# Patient Record
Sex: Male | Born: 1985 | Race: Black or African American | Hispanic: No | Marital: Married | Smoking: Never smoker
Health system: Southern US, Community
[De-identification: ages and names within clinical notes are randomized; demographics above are authoritative.]

---

## 2018-12-31 ENCOUNTER — Emergency Department: Payer: Commercial Managed Care - PPO

## 2018-12-31 ENCOUNTER — Other Ambulatory Visit: Payer: Self-pay

## 2018-12-31 ENCOUNTER — Emergency Department
Admission: EM | Admit: 2018-12-31 | Discharge: 2018-12-31 | Disposition: A | Discharge status: Home / Self Care | Payer: Commercial Managed Care - PPO | Attending: Emergency Medicine | Admitting: Emergency Medicine

## 2018-12-31 DIAGNOSIS — Y92410 Unspecified street and highway as the place of occurrence of the external cause: Secondary | ICD-10-CM | POA: Insufficient documentation

## 2018-12-31 DIAGNOSIS — S39012A Strain of muscle, fascia and tendon of lower back, initial encounter: Secondary | ICD-10-CM | POA: Diagnosis not present

## 2018-12-31 DIAGNOSIS — S3992XA Unspecified injury of lower back, initial encounter: Secondary | ICD-10-CM | POA: Diagnosis present

## 2018-12-31 DIAGNOSIS — Y999 Unspecified external cause status: Secondary | ICD-10-CM | POA: Insufficient documentation

## 2018-12-31 DIAGNOSIS — Y9389 Activity, other specified: Secondary | ICD-10-CM | POA: Diagnosis not present

## 2018-12-31 MED ORDER — MELOXICAM 15 MG PO TABS: 15.0000 mg | ORAL_TABLET | Freq: Every day | ORAL | 2 refills | Status: AC

## 2018-12-31 MED ORDER — CYCLOBENZAPRINE HCL 10 MG PO TABS: 10.0000 mg | ORAL_TABLET | Freq: Three times a day (TID) | ORAL | 0 refills | Status: AC | PRN
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
## 2018-12-31 NOTE — Discharge Instructions (Addendum)
Follow-up with your regular doctor if not better in 5 to 7 days we may see orthopedics.  Please return emergency department worsening.  Apply ice to all areas that hurt.  It is normal for you to feel more sore tomorrow and the next few days.  Take the medications to help decrease her symptoms.  Return as needed
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
## 2018-12-31 NOTE — ED Notes (Signed)

## 2019-01-11 ENCOUNTER — Emergency Department: Payer: Commercial Managed Care - PPO

## 2019-01-11 ENCOUNTER — Other Ambulatory Visit: Payer: Self-pay

## 2019-01-11 ENCOUNTER — Inpatient Hospital Stay
Admission: EM | Admit: 2019-01-11 | Discharge: 2019-01-13 | DRG: 494 | Disposition: A | Discharge status: Home Health | Payer: Commercial Managed Care - PPO | Attending: Internal Medicine | Admitting: Internal Medicine

## 2019-01-11 ENCOUNTER — Encounter: Payer: Self-pay | Admitting: Emergency Medicine

## 2019-01-11 DIAGNOSIS — S82201A Unspecified fracture of shaft of right tibia, initial encounter for closed fracture: Secondary | ICD-10-CM | POA: Diagnosis present

## 2019-01-11 DIAGNOSIS — Z20828 Contact with and (suspected) exposure to other viral communicable diseases: Secondary | ICD-10-CM | POA: Diagnosis present

## 2019-01-11 DIAGNOSIS — Z419 Encounter for procedure for purposes other than remedying health state, unspecified: Secondary | ICD-10-CM

## 2019-01-11 DIAGNOSIS — Z79899 Other long term (current) drug therapy: Secondary | ICD-10-CM

## 2019-01-11 DIAGNOSIS — S82231A Displaced oblique fracture of shaft of right tibia, initial encounter for closed fracture: Principal | ICD-10-CM | POA: Diagnosis present

## 2019-01-11 DIAGNOSIS — S82831A Other fracture of upper and lower end of right fibula, initial encounter for closed fracture: Secondary | ICD-10-CM | POA: Diagnosis present

## 2019-01-11 DIAGNOSIS — W010XXA Fall on same level from slipping, tripping and stumbling without subsequent striking against object, initial encounter: Secondary | ICD-10-CM | POA: Diagnosis present

## 2019-01-11 DIAGNOSIS — R030 Elevated blood-pressure reading, without diagnosis of hypertension: Secondary | ICD-10-CM | POA: Diagnosis present

## 2019-01-11 DIAGNOSIS — Y92009 Unspecified place in unspecified non-institutional (private) residence as the place of occurrence of the external cause: Secondary | ICD-10-CM

## 2019-01-11 DIAGNOSIS — X501XXA Overexertion from prolonged static or awkward postures, initial encounter: Secondary | ICD-10-CM | POA: Diagnosis not present

## 2019-01-11 DIAGNOSIS — S82241A Displaced spiral fracture of shaft of right tibia, initial encounter for closed fracture: Secondary | ICD-10-CM

## 2019-01-11 LAB — CBC WITH DIFFERENTIAL/PLATELET
Abs Immature Granulocytes: 0.03 10*3/uL (ref 0.00–0.07)
Basophils Absolute: 0 10*3/uL (ref 0.0–0.1)
Basophils Relative: 0 %
Eosinophils Absolute: 0 10*3/uL (ref 0.0–0.5)
Eosinophils Relative: 0 %
HCT: 44.6 % (ref 39.0–52.0)
Hemoglobin: 14 g/dL (ref 13.0–17.0)
Immature Granulocytes: 0 %
Lymphocytes Relative: 12 %
Lymphs Abs: 1.1 10*3/uL (ref 0.7–4.0)
MCH: 23.5 pg — ABNORMAL LOW (ref 26.0–34.0)
MCHC: 31.4 g/dL (ref 30.0–36.0)
MCV: 74.7 fL — ABNORMAL LOW (ref 80.0–100.0)
Monocytes Absolute: 0.6 10*3/uL (ref 0.1–1.0)
Monocytes Relative: 6 %
Neutro Abs: 7.8 10*3/uL — ABNORMAL HIGH (ref 1.7–7.7)
Neutrophils Relative %: 82 %
Platelets: 279 10*3/uL (ref 150–400)
RBC: 5.97 MIL/uL — ABNORMAL HIGH (ref 4.22–5.81)
RDW: 15.1 % (ref 11.5–15.5)
WBC: 9.5 10*3/uL (ref 4.0–10.5)
nRBC: 0 % (ref 0.0–0.2)

## 2019-01-11 LAB — PROTIME-INR
INR: 1 (ref 0.8–1.2)
Prothrombin Time: 13 seconds (ref 11.4–15.2)

## 2019-01-11 LAB — BASIC METABOLIC PANEL
Anion gap: 8 (ref 5–15)
BUN: 15 mg/dL (ref 6–20)
CO2: 26 mmol/L (ref 22–32)
Calcium: 9 mg/dL (ref 8.9–10.3)
Chloride: 102 mmol/L (ref 98–111)
Creatinine, Ser: 1.04 mg/dL (ref 0.61–1.24)
GFR calc Af Amer: >60 mL/min (ref >60)
GFR calc non Af Amer: >60 mL/min (ref >60)
Glucose, Bld: 147 mg/dL — ABNORMAL HIGH (ref 70–99)
Potassium: 4.1 mmol/L (ref 3.5–5.1)
Sodium: 136 mmol/L (ref 135–145)

## 2019-01-11 LAB — SARS CORONAVIRUS 2 BY RT PCR (HOSPITAL ORDER, PERFORMED IN ~~LOC~~ HOSPITAL LAB): SARS Coronavirus 2: NEGATIVE

## 2019-01-11 LAB — SURGICAL PCR SCREEN
MRSA, PCR: NEGATIVE
Staphylococcus aureus: NEGATIVE

## 2019-01-11 LAB — TYPE AND SCREEN
ABO/RH(D): O POS
Antibody Screen: NEGATIVE

## 2019-01-11 MED ORDER — CLINDAMYCIN PHOSPHATE 600 MG/50ML IV SOLN
600.0000 mg | INTRAVENOUS | Status: AC
  Filled 2019-01-11: qty 50

## 2019-01-11 MED ORDER — SODIUM CHLORIDE 0.9 % IV SOLN: INTRAVENOUS | Status: DC

## 2019-01-11 MED ORDER — MUPIROCIN 2 % EX OINT
1.0000 "application " | TOPICAL_OINTMENT | Freq: Two times a day (BID) | CUTANEOUS | Status: DC
  Filled 2019-01-11: qty 22

## 2019-01-11 MED ORDER — MORPHINE SULFATE (PF) 2 MG/ML IV SOLN: 1.0000 mg | INTRAVENOUS | Status: DC | PRN

## 2019-01-11 MED ORDER — HYDRALAZINE HCL 20 MG/ML IJ SOLN: 10.0000 mg | Freq: Four times a day (QID) | INTRAMUSCULAR | Status: DC | PRN

## 2019-01-11 MED ORDER — MORPHINE SULFATE (PF) 2 MG/ML IV SOLN
1.0000 mg | INTRAVENOUS | Status: DC | PRN
  Administered 2019-01-11 – 2019-01-12 (×3): 1 mg via INTRAVENOUS
  Filled 2019-01-11 (×3): qty 1

## 2019-01-11 MED ORDER — CEFAZOLIN SODIUM-DEXTROSE 1-4 GM/50ML-% IV SOLN
1.0000 g | INTRAVENOUS | Status: DC
  Filled 2019-01-11: qty 50

## 2019-01-11 MED ORDER — MORPHINE SULFATE (PF) 4 MG/ML IV SOLN
8.0000 mg | Freq: Once | INTRAVENOUS | Status: AC
  Administered 2019-01-11: 10:00:00 8 mg via INTRAVENOUS
  Filled 2019-01-11: qty 2

## 2019-01-11 MED ORDER — CEFAZOLIN SODIUM-DEXTROSE 2-4 GM/100ML-% IV SOLN
2.0000 g | INTRAVENOUS | Status: AC
  Filled 2019-01-11: qty 100

## 2019-01-11 MED ORDER — HYDROCODONE-ACETAMINOPHEN 5-325 MG PO TABS
1.0000 | ORAL_TABLET | ORAL | Status: DC | PRN
  Administered 2019-01-11 – 2019-01-13 (×7): 2 via ORAL
  Filled 2019-01-11 (×7): qty 2

## 2019-01-11 MED ORDER — CEFAZOLIN SODIUM-DEXTROSE 2-4 GM/100ML-% IV SOLN: 2.0000 g | Freq: Once | INTRAVENOUS | Status: DC

## 2019-01-11 MED ORDER — SODIUM CHLORIDE 0.9 % IV SOLN
INTRAVENOUS | Status: DC
  Administered 2019-01-11 (×2): via INTRAVENOUS

## 2019-01-11 NOTE — ED Notes (Addendum)
EDP and ED tech at bedside to apply splint to RLE
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
## 2019-01-11 NOTE — ED Notes (Signed)
Attempt to call report, assignment not approved yet. Will return call.

## 2019-01-11 NOTE — Consult Note (Signed)
ORTHOPAEDIC CONSULTATION  REQUESTING PHYSICIAN: Lang Snow,*  Chief Complaint: Right leg pain  HPI: Jesse Wagner is a 33 y.o. male who complains of  Right leg after a fall at home today. Brought to ER where exam and x-rays show a displaced right distal 1/3 tibial fracture, short, oblique.  Proximal fibula fx as well. Admitted for surgical fixation of fracture. Discussed surgical vs. Non operative treatment with him and he agrees to surgery. Very difficult to control this fx in a cast. Risks and benefits and post op protocol dicussed.   History reviewed. No pertinent past medical history. History reviewed. No pertinent surgical history. Social History   Socioeconomic History  . Marital status: Married    Spouse name: Not on file  . Number of children: Not on file  . Years of education: Not on file  . Highest education level: Not on file  Occupational History  . Not on file  Social Needs  . Financial resource strain: Not on file  . Food insecurity    Worry: Not on file    Inability: Not on file  . Transportation needs    Medical: Not on file    Non-medical: Not on file  Tobacco Use  . Smoking status: Never Smoker  . Smokeless tobacco: Never Used  Substance and Sexual Activity  . Alcohol use: Never    Frequency: Never  . Drug use: Never  . Sexual activity: Not on file  Lifestyle  . Physical activity    Days per week: Not on file    Minutes per session: Not on file  . Stress: Not on file  Relationships  . Social Herbalist on phone: Not on file    Gets together: Not on file    Attends religious service: Not on file    Active member of club or organization: Not on file    Attends meetings of clubs or organizations: Not on file    Relationship status: Not on file  Other Topics Concern  . Not on file  Social History Narrative  . Not on file   History reviewed. No pertinent family history. No Known Allergies Prior to Admission medications    Medication Sig Start Date End Date Taking? Authorizing Provider  cyclobenzaprine (FLEXERIL) 10 MG tablet Take 1 tablet (10 mg total) by mouth 3 (three) times daily as needed. Patient taking differently: Take 10 mg by mouth 3 (three) times daily as needed for muscle spasms.  12/31/18  Yes Fisher, Linden Dolin, PA-C  meloxicam (MOBIC) 15 MG tablet Take 1 tablet (15 mg total) by mouth daily. Patient taking differently: Take 15 mg by mouth daily as needed for pain.  12/31/18 12/31/19 Yes Fisher, Linden Dolin, PA-C   Dg Knee 2 Views Right  Result Date: 01/11/2019 CLINICAL DATA:  Right knee pain after fall today. EXAM: RIGHT KNEE - 1-2 VIEW COMPARISON:  None. FINDINGS: Moderately displaced oblique fracture is seen involving the proximal right fibular shaft. No other bony abnormality is noted involving the knee. No joint effusion is noted. Joint spaces are intact. IMPRESSION: Moderately displaced proximal right fibular fracture. Electronically Signed   By: Marijo Conception M.D.   On: 01/11/2019 10:07   Dg Ankle Complete Right  Result Date: 01/11/2019 CLINICAL DATA:  Right leg pain after fall. EXAM: RIGHT ANKLE - COMPLETE 3+ VIEW COMPARISON:  None. FINDINGS: Severely displaced oblique fracture is seen involving the distal right tibial shaft. No other definite abnormality is noted in the  ankle. Joint spaces are intact. IMPRESSION: Severely displaced distal right tibial oblique fracture. Electronically Signed   By: Lupita RaiderJames  Green Jr M.D.   On: 01/11/2019 10:05    Positive ROS: All other systems have been reviewed and were otherwise negative with the exception of those mentioned in the HPI and as above.  Physical Exam: General: Alert, no acute distress Cardiovascular: No pedal edema Respiratory: No cyanosis, no use of accessory musculature GI: No organomegaly, abdomen is soft and non-tender Skin: No lesions in the area of chief complaint Neurologic: Sensation intact distally Psychiatric: Patient is competent for  consent with normal mood and affect Lymphatic: No axillary or cervical lymphadenopathy  MUSCULOSKELETAL: Right leg in splints.  CSM good distally . No other injuries noted.   Assessment: Right tibial fx with displacement  Plan: ORIF right tibia with IM rod    Valinda HoarHoward E Dulcemaria Bula, MD [phone]   01/11/2019 3:12 PM

## 2019-01-11 NOTE — ED Notes (Signed)
Report to Kim, RN

## 2019-01-11 NOTE — H&P (Signed)
[employer] [facility] - [place] at [facility]   PATIENT NAME: Jesse Wagner    MR#:  [id number]  DATE OF BIRTH:  09/12/1985  DATE OF ADMISSION:  01/11/2019  PRIMARY CARE PHYSICIAN: Patient, No Pcp Per   REQUESTING/REFERRING PHYSICIAN: Chesley NoonJessup, Carnie, MD  CHIEF COMPLAINT:   Chief Complaint  Patient presents with  . Leg Pain    HISTORY OF PRESENT ILLNESS:   33 year old male with no known past medical history presenting to the ED with chief complaints of right leg pain status post mechanical fall this morning.  Patient states he tripped on a child toy that was laying on a rug outside of his house and fell forward on the ground inverted his ankle.  He denies hitting his head or losing consciousness.  Patient states he was unable to get up without assistance and currently unable to bear weight on his right leg.  On arrival to the ED, he was afebrile with blood pressure 161/96 mm Hg and pulse rate 108 beats/min. There were no focal neurological deficits; he was alert and oriented x4 but appears to be in significant pain.  Initial labs with unremarkable CBC and BMP.  X-ray of right ankle shows severely displaced distal right tibial oblique fracture, x-ray of right knee shows moderately displaced proximal right fibular fracture.  Given this finding on imaging patient will be admitted under hospitalist service for further management.  PAST MEDICAL HISTORY:  History reviewed. No pertinent past medical history.  PAST SURGICAL HISTORY:  History reviewed. No pertinent surgical history.  SOCIAL HISTORY:   Social History   Tobacco Use  . Smoking status: Never Smoker  . Smokeless tobacco: Never Used  Substance Use Topics  . Alcohol use: Never    Frequency: Never    FAMILY HISTORY:  No family history on file.  DRUG ALLERGIES:  No Known Allergies  REVIEW OF SYSTEMS:   Review of Systems  Constitutional: Negative for chills, fever, malaise/fatigue and weight loss.  HENT:  Negative for congestion, hearing loss and sore throat.   Eyes: Negative for blurred vision and double vision.  Respiratory: Negative for cough, shortness of breath and wheezing.   Cardiovascular: Positive for leg swelling. Negative for chest pain, palpitations and orthopnea.       Right leg swelling due to traumatic fall  Gastrointestinal: Negative for abdominal pain, diarrhea, nausea and vomiting.  Genitourinary: Negative for dysuria and urgency.  Musculoskeletal: Positive for falls and joint pain. Negative for myalgias.  Skin: Negative for rash.  Neurological: Negative for dizziness, sensory change, speech change, focal weakness and headaches.  Psychiatric/Behavioral: Negative for depression.   MEDICATIONS AT HOME:   Prior to Admission medications   Medication Sig Start Date End Date Taking? Authorizing Provider  cyclobenzaprine (FLEXERIL) 10 MG tablet Take 1 tablet (10 mg total) by mouth 3 (three) times daily as needed. 12/31/18   Fisher, Roselyn BeringSusan W, PA-C  meloxicam (MOBIC) 15 MG tablet Take 1 tablet (15 mg total) by mouth daily. 12/31/18 12/31/19  Fisher, Roselyn BeringSusan W, PA-C     VITAL SIGNS:  Blood pressure (!) 145/82, pulse 94, temperature 99 F (37.2 C), temperature source Oral, resp. rate 18, height 5\' 8"  (1.727 m), weight 83.5 kg, SpO2 96 %.  PHYSICAL EXAMINATION:   Physical Exam  GENERAL:  33 y.o.-year-old patient lying in the bed with no acute distress.  EYES: Pupils equal, round, reactive to light and accommodation. No scleral icterus. Extraocular muscles intact.  HEENT: Head atraumatic, normocephalic. Oropharynx and nasopharynx clear.  NECK:  Supple, no jugular venous distention. No thyroid enlargement, no tenderness.  LUNGS: Normal breath sounds bilaterally, no wheezing, rales,rhonchi or crepitation. No use of accessory muscles of respiration.  CARDIOVASCULAR: S1, S2 normal. No murmurs, rubs, or gallops.  ABDOMEN: Soft, nontender, nondistended. Bowel sounds present. No organomegaly  or mass.  EXTREMITIES: No pedal edema, cyanosis, or clubbing. No rash or lesions. + pedal pulses MUSCULOSKELETAL: Normal bulk, and power was 5+ grip and elbow, knee, and ankle flexion and extension on the left. Unable to asses the right due to injury NEUROLOGIC:Alert and oriented x 3. CN 2-12 intact. Sensation to light touch and cold stimuli intact bilaterally. Gait not tested due to safety concern. PSYCHIATRIC: The patient is alert and oriented x 3.  SKIN: Erythema in anterior aspect of right leg with obvious deformity  DATA REVIEWED:  LABORATORY PANEL:   CBC Recent Labs  Lab 01/11/19 0937  WBC 9.5  HGB 14.0  HCT 44.6  PLT 279   ------------------------------------------------------------------------------------------------------------------  Chemistries  Recent Labs  Lab 01/11/19 0937  NA 136  K 4.1  CL 102  CO2 26  GLUCOSE 147*  BUN 15  CREATININE 1.04  CALCIUM 9.0   ------------------------------------------------------------------------------------------------------------------  Cardiac Enzymes No results for input(s): TROPONINI in the last 168 hours. ------------------------------------------------------------------------------------------------------------------  RADIOLOGY:  Dg Knee 2 Views Right  Result Date: 01/11/2019 CLINICAL DATA:  Right knee pain after fall today. EXAM: RIGHT KNEE - 1-2 VIEW COMPARISON:  None. FINDINGS: Moderately displaced oblique fracture is seen involving the proximal right fibular shaft. No other bony abnormality is noted involving the knee. No joint effusion is noted. Joint spaces are intact. IMPRESSION: Moderately displaced proximal right fibular fracture. Electronically Signed   By: Marijo Conception M.D.   On: 01/11/2019 10:07   Dg Ankle Complete Right  Result Date: 01/11/2019 CLINICAL DATA:  Right leg pain after fall. EXAM: RIGHT ANKLE - COMPLETE 3+ VIEW COMPARISON:  None. FINDINGS: Severely displaced oblique fracture is seen  involving the distal right tibial shaft. No other definite abnormality is noted in the ankle. Joint spaces are intact. IMPRESSION: Severely displaced distal right tibial oblique fracture. Electronically Signed   By: Marijo Conception M.D.   On: 01/11/2019 10:05    EKG:  EKG: there are no previous tracings available.  IMPRESSION AND PLAN:   33 y.o. male  with no known past medical history presenting to the ED with chief complaints of right leg pain status post mechanical fall this morning.  1. Right tibial fracture -secondary to mechanical fall - Admit to MedSurg unit - X-ray of right ankle shows severely displaced distal right tibial oblique fracture - PRN p.o. and IV pain management - We will avoid anticoagulation pending surgery - Keep n.p.o. - Cleared for surgery from medicine standpoint - Orthopedic consult, discussed with Dr. Sabra Heck for possible surgery today  2. Right fibula fracture -secondary to mechanical fall -X-ray right knee shows moderately displaced proximal right fibula fracture -Management as above  3. Elevated blood pressure -may be due to pain, no history of hypertension -PRN antihypertensive  4. DVT prophylaxis - Hold anti-coagulation for pending procedure   All the records are reviewed and case discussed with ED provider. Management plans discussed with the patient, family and they are in agreement.  CODE STATUS: FULL  TOTAL TIME TAKING CARE OF THIS PATIENT: 50 minutes.    on 01/11/2019 at 10:25 AM   Rufina Falco, DNP, FNP-BC [facility] Nurse Practitioner Between 7am to 6pm - Pager -  [phone]  After 6pm go to [email or web address] - password [email or web address]  [facility]  Office  [phone]  CC: Primary care physician; Patient, No Pcp Per

## 2019-01-11 NOTE — ED Notes (Signed)
Patient ready to transport to floor per charge, tech transport to room 135

## 2019-01-11 NOTE — Progress Notes (Signed)
Per protocol, cefazolin pre-surgical dose adjusted to 2 g as pt is >/= 80 kg.  Gerald Dexter, PharmD Pharmacy Resident  01/11/2019 1:17 PM

## 2019-01-11 NOTE — ED Notes (Signed)
Cont to be unable to take report.

## 2019-01-11 NOTE — ED Triage Notes (Signed)
Pt in via POV, reports mechanical fall outside of his house, pain from right ankle up to knee; obvious deformity noted.  Pt unable to bear weight to that extremity since the fall.  nad noted at this time.

## 2019-01-11 NOTE — Progress Notes (Signed)
Patient seen and examined  Agree with the plan and assessment formulated by Rufina Falco, NP. Patient admitted with right tibial fracture.  Patient will be seen by orthopedic surgery for possible surgery.  Elevated blood pressure is due to pain will use PRN medications   15 minutes spent

## 2019-01-11 NOTE — ED Notes (Signed)
X-ray at bedside

## 2019-01-11 NOTE — ED Provider Notes (Signed)
[facility] Emergency Department Provider Note   ____________________________________________   First MD Initiated Contact with Patient 01/11/19 [phone]     (approximate)  I have reviewed the triage vital signs and the nursing notes.   HISTORY  Chief Complaint Leg Pain    HPI Jesse Wagner is a 33 y.o. male with no significant past medical history who presents to the ED complaining of ankle and knee pain.  Patient reports that earlier this morning he tripped and fell on a mat outside of his house, inverting his ankle and falling to the ground.  He has had significant pain and swelling around his right ankle since then, along with pain and swelling in the distal part of his right knee.  He has been unable to bear weight on this leg, but has not had any injury to his left lower extremity and bears weight on his left leg without difficulty.  He denies hitting his head or any other injuries.  He last ate some sausage and eggs around 9 AM this morning.        History reviewed. No pertinent past medical history.  Patient Active Problem List   Diagnosis Date Noted  . Right tibial fracture 01/11/2019    History reviewed. No pertinent surgical history.  Prior to Admission medications   Medication Sig Start Date End Date Taking? Authorizing Provider  cyclobenzaprine (FLEXERIL) 10 MG tablet Take 1 tablet (10 mg total) by mouth 3 (three) times daily as needed. Patient taking differently: Take 10 mg by mouth 3 (three) times daily as needed for muscle spasms.  12/31/18  Yes Fisher, Linden Dolin, PA-C  meloxicam (MOBIC) 15 MG tablet Take 1 tablet (15 mg total) by mouth daily. Patient taking differently: Take 15 mg by mouth daily as needed for pain.  12/31/18 12/31/19 Yes Fisher, Linden Dolin, PA-C    Allergies Patient has no known allergies.  No family history on file.  Social History Social History   Tobacco Use  . Smoking status: Never Smoker  . Smokeless tobacco:  Never Used  Substance Use Topics  . Alcohol use: Never    Frequency: Never  . Drug use: Never    Review of Systems  Constitutional: No fever/chills Eyes: No visual changes. ENT: No sore throat. Cardiovascular: Denies chest pain. Respiratory: Denies shortness of breath. Gastrointestinal: No abdominal pain.  No nausea, no vomiting.  No diarrhea.  No constipation. Genitourinary: Negative for dysuria. Musculoskeletal: Negative for back pain.  Positive for right ankle and knee pain. Skin: Negative for rash. Neurological: Negative for headaches, focal weakness or numbness.  ____________________________________________   PHYSICAL EXAM:  VITAL SIGNS: ED Triage Vitals  Enc Vitals Group     BP 01/11/19 0923 (!) 161/96     Pulse Rate 01/11/19 0923 (!) 108     Resp 01/11/19 0923 16     Temp 01/11/19 0923 99 F (37.2 C)     Temp Source 01/11/19 0923 Oral     SpO2 01/11/19 0923 98 %     Weight 01/11/19 0924 184 lb (83.5 kg)     Height 01/11/19 0924 5\' 8"  (1.727 m)     Head Circumference --      Peak Flow --      Pain Score 01/11/19 0923 10     Pain Loc --      Pain Edu? --      Excl. in [place]? --     Constitutional: Alert and oriented. Eyes: Conjunctivae are normal.  Head: Atraumatic. Nose: No congestion/rhinnorhea. Mouth/Throat: Mucous membranes are moist. Neck: Normal ROM Cardiovascular: Normal rate, regular rhythm. Grossly normal heart sounds. Respiratory: Normal respiratory effort.  No retractions. Lungs CTAB. Gastrointestinal: Soft and nontender. No distention. Genitourinary: deferred Musculoskeletal: Edema to right ankle diffusely with tenderness along medial malleolus, no tenderness along head of first and fifth metatarsals.  Edema to proximal right shin with tenderness along lateral lower leg, no tenderness to more proximal knee.  2+ DP pulses bilaterally, right lower extremity warm and well-perfused. Neurologic:  Normal speech and language. No gross focal neurologic  deficits are appreciated.  Sensation intact throughout right lower extremity. Skin:  Skin is warm, dry and intact. No rash noted. Psychiatric: Mood and affect are normal. Speech and behavior are normal.  ____________________________________________   LABS (all labs ordered are listed, but only abnormal results are displayed)  Labs Reviewed  BASIC METABOLIC PANEL - Abnormal; Notable for the following components:      Result Value   Glucose, Bld 147 (*)    All other components within normal limits  CBC WITH DIFFERENTIAL/PLATELET - Abnormal; Notable for the following components:   RBC 5.97 (*)    MCV 74.7 (*)    MCH 23.5 (*)    Neutro Abs 7.8 (*)    All other components within normal limits  SARS CORONAVIRUS 2 (HOSPITAL ORDER, PERFORMED IN [facility])  PROTIME-INR  HIV ANTIBODY (ROUTINE TESTING W REFLEX)  TYPE AND SCREEN     PROCEDURES  Procedure(s) performed (including Critical Care):  .Ortho Injury Treatment  Date/Time: 01/11/2019 2:47 PM Performed by: Chesley NoonJessup, Milton, MD Authorized by: Chesley NoonJessup, Loki, MD   Consent:    Consent obtained:  Verbal   Consent given by:  PatientInjury location: lower leg Location details: right lower leg Injury type: fracture Fracture type: tibial shaft Pre-procedure neurovascular assessment: neurovascularly intact Pre-procedure distal perfusion: normal Pre-procedure neurological function: normal Pre-procedure range of motion: reduced  Anesthesia: Local anesthesia used: no  Patient sedated: NoManipulation performed: yes Skin traction used: no Skeletal traction used: yes Reduction successful: yes Immobilization: splint Splint type: short leg Supplies used: cotton padding and Ortho-Glass Post-procedure neurovascular assessment: post-procedure neurovascularly intact Post-procedure distal perfusion: normal Post-procedure neurological function: normal Post-procedure range of motion: unchanged Patient tolerance: patient  tolerated the procedure well with no immediate complications      ____________________________________________   INITIAL IMPRESSION / ASSESSMENT AND PLAN / ED COURSE       33 year old male presenting to the ED following trip and fall, everting his right ankle.  He has significant pain with obvious deformity to right ankle and tenderness at proximal lower leg, concerning for Maisonneuve fracture.  Will assess with x-rays, treat pain.  No other apparent traumatic injuries.  X-rays show fracture of distal tibial shaft with ankle mortise intact, additional fracture noted at proximal fibula.  Case discussed with Dr. Hyacinth MeekerMiller of orthopedic surgery, recommends placement of U-splint and short leg splint, will plan for operative intervention later this afternoon.  Splint applied for patient comfort and stabilization with reduction of displaced tibial fracture.  Case discussed with hospitalist, who accepts patient for admission.      ____________________________________________   FINAL CLINICAL IMPRESSION(S) / ED DIAGNOSES  Final diagnoses:  Closed displaced oblique fracture of shaft of right tibia, initial encounter  Closed fracture of proximal end of right fibula, unspecified fracture morphology, initial encounter     ED Discharge Orders    None       Note:  This document  was prepared using Conservation officer, [employer] voice recognition software and may include unintentional dictation errors.   Chesley NoonJessup, Marquan, MD 01/11/19 [phone]

## 2019-01-11 NOTE — Anesthesia Preprocedure Evaluation (Addendum)
Anesthesia Evaluation  Patient identified by MRN, date of birth, ID band Patient awake    Reviewed: Allergy & Precautions, H&P , NPO status , Patient's Chart, lab work & pertinent test results  History of Anesthesia Complications Negative for: history of anesthetic complications  Airway Mallampati: II  TM Distance: >3 FB Neck ROM: full    Dental  (+) Chipped, Poor Dentition   Pulmonary neg pulmonary ROS, neg shortness of breath,           Cardiovascular Exercise Tolerance: Good (-) angina(-) Past MI and (-) DOE negative cardio ROS       Neuro/Psych negative neurological ROS  negative psych ROS   GI/Hepatic negative GI ROS, Neg liver ROS, neg GERD  ,  Endo/Other  negative endocrine ROS  Renal/GU      Musculoskeletal   Abdominal   Peds  Hematology negative hematology ROS (+)   Anesthesia Other Findings  BMI    Body Mass Index: 27.98 kg/m      Reproductive/Obstetrics negative OB ROS                             Anesthesia Physical Anesthesia Plan  ASA: I  Anesthesia Plan: Spinal   Post-op Pain Management:    Induction: Intravenous  PONV Risk Score and Plan: Ondansetron, Dexamethasone, Midazolam and Treatment may vary due to age or medical condition  Airway Management Planned: Nasal Cannula  Additional Equipment:   Intra-op Plan:   Post-operative Plan:   Informed Consent: I have reviewed the patients History and Physical, chart, labs and discussed the procedure including the risks, benefits and alternatives for the proposed anesthesia with the patient or authorized representative who has indicated his/her understanding and acceptance.     Dental Advisory Given  Plan Discussed with: Anesthesiologist, CRNA and Surgeon  Anesthesia Plan Comments: (Patient consented for risks of anesthesia including but not limited to:  - adverse reactions to medications - damage to  teeth, lips or other oral mucosa - sore throat or hoarseness - Damage to heart, brain, lungs or loss of life  Patient voiced understanding and prefers spinal for case...understands risks and benefits.)    Anesthesia Quick Evaluation

## 2019-01-12 ENCOUNTER — Encounter: Payer: Self-pay | Admitting: Anesthesiology

## 2019-01-12 ENCOUNTER — Inpatient Hospital Stay: Payer: Commercial Managed Care - PPO | Admitting: Anesthesiology

## 2019-01-12 ENCOUNTER — Inpatient Hospital Stay: Payer: Commercial Managed Care - PPO

## 2019-01-12 ENCOUNTER — Encounter
Admission: EM | Disposition: A | Discharge status: Home Health | Payer: Self-pay | Source: Home / Self Care | Attending: Internal Medicine

## 2019-01-12 HISTORY — PX: TIBIA IM NAIL INSERTION: SHX2516

## 2019-01-12 LAB — CBC
HCT: 42.7 % (ref 39.0–52.0)
Hemoglobin: 13 g/dL (ref 13.0–17.0)
MCH: 23.6 pg — ABNORMAL LOW (ref 26.0–34.0)
MCHC: 30.4 g/dL (ref 30.0–36.0)
MCV: 77.6 fL — ABNORMAL LOW (ref 80.0–100.0)
Platelets: 263 10*3/uL (ref 150–400)
RBC: 5.5 MIL/uL (ref 4.22–5.81)
RDW: 15.7 % — ABNORMAL HIGH (ref 11.5–15.5)
WBC: 6.9 10*3/uL (ref 4.0–10.5)
nRBC: 0 % (ref 0.0–0.2)

## 2019-01-12 LAB — CREATININE, SERUM
Creatinine, Ser: 1.13 mg/dL (ref 0.61–1.24)
GFR calc Af Amer: >60 mL/min (ref >60)
GFR calc non Af Amer: >60 mL/min (ref >60)

## 2019-01-12 SURGERY — INSERTION, INTRAMEDULLARY ROD, TIBIA
Anesthesia: Spinal | Laterality: Right

## 2019-01-12 MED ORDER — BISACODYL 10 MG RE SUPP: 10.0000 mg | Freq: Every day | RECTAL | Status: DC | PRN

## 2019-01-12 MED ORDER — FENTANYL CITRATE (PF) 100 MCG/2ML IJ SOLN
INTRAMUSCULAR | Status: AC
  Filled 2019-01-12: qty 2

## 2019-01-12 MED ORDER — CLINDAMYCIN PHOSPHATE 600 MG/50ML IV SOLN
600.0000 mg | Freq: Three times a day (TID) | INTRAVENOUS | Status: AC
  Administered 2019-01-12 – 2019-01-13 (×3): 600 mg via INTRAVENOUS
  Filled 2019-01-12 (×3): qty 50

## 2019-01-12 MED ORDER — PROPOFOL 10 MG/ML IV BOLUS
INTRAVENOUS | Status: AC
  Filled 2019-01-12: qty 20

## 2019-01-12 MED ORDER — METOCLOPRAMIDE HCL 5 MG/ML IJ SOLN: 5.0000 mg | Freq: Three times a day (TID) | INTRAMUSCULAR | Status: DC | PRN

## 2019-01-12 MED ORDER — LIDOCAINE HCL (PF) 2 % IJ SOLN
INTRAMUSCULAR | Status: AC
  Filled 2019-01-12: qty 10

## 2019-01-12 MED ORDER — OXYCODONE HCL 5 MG/5ML PO SOLN: 5.0000 mg | Freq: Once | ORAL | Status: DC | PRN

## 2019-01-12 MED ORDER — BUPIVACAINE HCL (PF) 0.5 % IJ SOLN
INTRAMUSCULAR | Status: DC | PRN
  Administered 2019-01-12: 3 mL

## 2019-01-12 MED ORDER — CLINDAMYCIN PHOSPHATE 600 MG/50ML IV SOLN
INTRAVENOUS | Status: DC | PRN
  Administered 2019-01-12: 600 mg via INTRAVENOUS

## 2019-01-12 MED ORDER — ONDANSETRON HCL 4 MG PO TABS: 4.0000 mg | ORAL_TABLET | Freq: Four times a day (QID) | ORAL | Status: DC | PRN

## 2019-01-12 MED ORDER — SODIUM CHLORIDE 0.45 % IV SOLN
INTRAVENOUS | Status: DC
  Administered 2019-01-12: 17:00:00 via INTRAVENOUS

## 2019-01-12 MED ORDER — METHOCARBAMOL 1000 MG/10ML IJ SOLN
500.0000 mg | Freq: Four times a day (QID) | INTRAVENOUS | Status: DC | PRN
  Filled 2019-01-12: qty 5

## 2019-01-12 MED ORDER — METHOCARBAMOL 500 MG PO TABS
500.0000 mg | ORAL_TABLET | Freq: Four times a day (QID) | ORAL | Status: DC | PRN
  Administered 2019-01-12 – 2019-01-13 (×2): 500 mg via ORAL
  Filled 2019-01-12 (×2): qty 1

## 2019-01-12 MED ORDER — MIDAZOLAM HCL 2 MG/2ML IJ SOLN
INTRAMUSCULAR | Status: AC
  Filled 2019-01-12: qty 2

## 2019-01-12 MED ORDER — CEFAZOLIN SODIUM-DEXTROSE 2-3 GM-%(50ML) IV SOLR
INTRAVENOUS | Status: DC | PRN
  Administered 2019-01-12: 2 g via INTRAVENOUS

## 2019-01-12 MED ORDER — ONDANSETRON HCL 4 MG/2ML IJ SOLN
INTRAMUSCULAR | Status: AC
  Filled 2019-01-12: qty 2

## 2019-01-12 MED ORDER — DEXAMETHASONE SODIUM PHOSPHATE 10 MG/ML IJ SOLN
INTRAMUSCULAR | Status: AC
  Filled 2019-01-12: qty 1

## 2019-01-12 MED ORDER — ROCURONIUM BROMIDE 50 MG/5ML IV SOLN
INTRAVENOUS | Status: AC
  Filled 2019-01-12: qty 1

## 2019-01-12 MED ORDER — FENTANYL CITRATE (PF) 100 MCG/2ML IJ SOLN: 25.0000 ug | INTRAMUSCULAR | Status: DC | PRN

## 2019-01-12 MED ORDER — DOCUSATE SODIUM 100 MG PO CAPS
100.0000 mg | ORAL_CAPSULE | Freq: Two times a day (BID) | ORAL | Status: DC
  Administered 2019-01-12 – 2019-01-13 (×2): 100 mg via ORAL
  Filled 2019-01-12 (×2): qty 1

## 2019-01-12 MED ORDER — METOCLOPRAMIDE HCL 10 MG PO TABS: 5.0000 mg | ORAL_TABLET | Freq: Three times a day (TID) | ORAL | Status: DC | PRN

## 2019-01-12 MED ORDER — ONDANSETRON HCL 4 MG/2ML IJ SOLN: 4.0000 mg | Freq: Four times a day (QID) | INTRAMUSCULAR | Status: DC | PRN

## 2019-01-12 MED ORDER — PROPOFOL 10 MG/ML IV BOLUS
INTRAVENOUS | Status: DC | PRN
  Administered 2019-01-12 (×2): 30 mg via INTRAVENOUS

## 2019-01-12 MED ORDER — ONDANSETRON HCL 4 MG/2ML IJ SOLN: 4.0000 mg | Freq: Once | INTRAMUSCULAR | Status: DC | PRN

## 2019-01-12 MED ORDER — PROPOFOL 500 MG/50ML IV EMUL
INTRAVENOUS | Status: DC | PRN
  Administered 2019-01-12: 75 ug/kg/min via INTRAVENOUS

## 2019-01-12 MED ORDER — OXYCODONE HCL 5 MG PO TABS: 5.0000 mg | ORAL_TABLET | Freq: Once | ORAL | Status: DC | PRN

## 2019-01-12 MED ORDER — BUPIVACAINE-EPINEPHRINE (PF) 0.25% -1:200000 IJ SOLN
INTRAMUSCULAR | Status: DC | PRN
  Administered 2019-01-12: 30 mL via PERINEURAL

## 2019-01-12 MED ORDER — NEOMYCIN-POLYMYXIN B GU 40-200000 IR SOLN
Status: DC | PRN
  Administered 2019-01-12: 4 mL

## 2019-01-12 MED ORDER — ENOXAPARIN SODIUM 30 MG/0.3ML ~~LOC~~ SOLN
30.0000 mg | SUBCUTANEOUS | Status: DC
  Administered 2019-01-13: 11:00:00 30 mg via SUBCUTANEOUS
  Filled 2019-01-12: qty 0.3

## 2019-01-12 MED ORDER — FENTANYL CITRATE (PF) 100 MCG/2ML IJ SOLN
INTRAMUSCULAR | Status: DC | PRN
  Administered 2019-01-12 (×2): 50 ug via INTRAVENOUS

## 2019-01-12 MED ORDER — CEFAZOLIN SODIUM-DEXTROSE 2-4 GM/100ML-% IV SOLN
2.0000 g | Freq: Three times a day (TID) | INTRAVENOUS | Status: DC
  Administered 2019-01-12 – 2019-01-13 (×2): 2 g via INTRAVENOUS
  Filled 2019-01-12 (×4): qty 100

## 2019-01-12 MED ORDER — FLEET ENEMA 7-19 GM/118ML RE ENEM: 1.0000 | ENEMA | Freq: Once | RECTAL | Status: DC | PRN

## 2019-01-12 MED ORDER — MAGNESIUM HYDROXIDE 400 MG/5ML PO SUSP: 30.0000 mL | Freq: Every day | ORAL | Status: DC | PRN

## 2019-01-12 MED ORDER — MIDAZOLAM HCL 5 MG/5ML IJ SOLN
INTRAMUSCULAR | Status: DC | PRN
  Administered 2019-01-12 (×2): 1 mg via INTRAVENOUS

## 2019-01-12 SURGICAL SUPPLY — 42 items
BIT DRILL 4.4 (MISCELLANEOUS) ×2 IMPLANT
BIT DRILL 6X3.8 (MISCELLANEOUS) ×2 IMPLANT
CANISTER SUCT 1200ML W/VALVE (MISCELLANEOUS) ×3 IMPLANT
CHLORAPREP W/TINT 26 (MISCELLANEOUS) ×3 IMPLANT
COVER WAND RF STERILE (DRAPES) ×3 IMPLANT
CUFF TOURN SGL QUICK 24 (TOURNIQUET CUFF)
CUFF TOURN SGL QUICK 30 (TOURNIQUET CUFF) ×2
CUFF TRNQT CYL 24X4X16.5-23 (TOURNIQUET CUFF) IMPLANT
CUFF TRNQT CYL 30X4X21-28X (TOURNIQUET CUFF) IMPLANT
DRAPE C-ARM XRAY 36X54 (DRAPES) ×3 IMPLANT
DRAPE C-ARMOR (DRAPES) ×2 IMPLANT
DRAPE U-SHAPE 47X51 STRL (DRAPES) ×3 IMPLANT
DRSG AQUACEL AG ADV 3.5X10 (GAUZE/BANDAGES/DRESSINGS) ×1 IMPLANT
ELECT REM PT RETURN 9FT ADLT (ELECTROSURGICAL) ×3
ELECTRODE REM PT RTRN 9FT ADLT (ELECTROSURGICAL) ×1 IMPLANT
GAUZE SPONGE 4X4 12PLY STRL (GAUZE/BANDAGES/DRESSINGS) ×3 IMPLANT
GAUZE XEROFORM 1X8 LF (GAUZE/BANDAGES/DRESSINGS) ×3 IMPLANT
GLOVE INDICATOR 8.0 STRL GRN (GLOVE) ×3 IMPLANT
GLOVE SURG ORTHO 8.5 STRL (GLOVE) ×3 IMPLANT
GLOVE SURG XRAY 8.0 LX (GLOVE) ×3 IMPLANT
GOWN STRL REUS W/ TWL LRG LVL3 (GOWN DISPOSABLE) ×1 IMPLANT
GOWN STRL REUS W/TWL LRG LVL3 (GOWN DISPOSABLE) ×2
GOWN STRL REUS W/TWL LRG LVL4 (GOWN DISPOSABLE) ×3 IMPLANT
GUIDEPIN 3.2X17.5 THRD DISP (PIN) ×2 IMPLANT
GUIDEWIRE BALL NOSE 100CM (WIRE) ×3 IMPLANT
HEMOVAC 400CC 10FR (MISCELLANEOUS) ×1 IMPLANT
KIT TURNOVER KIT A (KITS) ×3 IMPLANT
NAIL TIBIAL 9MMX33CM (Nail) ×2 IMPLANT
NDL SPNL 18GX3.5 QUINCKE PK (NEEDLE) ×1 IMPLANT
NEEDLE SPNL 18GX3.5 QUINCKE PK (NEEDLE) ×3 IMPLANT
NS IRRIG 1000ML POUR BTL (IV SOLUTION) ×3 IMPLANT
PACK TOTAL KNEE (MISCELLANEOUS) ×3 IMPLANT
PAD ABD DERMACEA PRESS 5X9 (GAUZE/BANDAGES/DRESSINGS) ×3 IMPLANT
SCREW ACECAP 38MM (Screw) ×2 IMPLANT
SCREW PROXIMAL DEPUY (Screw) ×2 IMPLANT
SCREW PRXML FT 50X5.5XLCK NS (Screw) IMPLANT
SPONGE LAP 18X18 RF (DISPOSABLE) ×3 IMPLANT
STAPLER SKIN PROX 35W (STAPLE) ×3 IMPLANT
SUT VIC AB 0 CT1 36 (SUTURE) ×6 IMPLANT
SUT VIC AB 2-0 CT1 27 (SUTURE) ×4
SUT VIC AB 2-0 CT1 TAPERPNT 27 (SUTURE) ×2 IMPLANT
SYR 10ML LL (SYRINGE) ×3 IMPLANT

## 2019-01-12 NOTE — H&P (Signed)
THE PATIENT WAS SEEN PRIOR TO SURGERY TODAY.  HISTORY, ALLERGIES, HOME MEDICATIONS AND OPERATIVE PROCEDURE WERE REVIEWED. RISKS AND BENEFITS OF SURGERY DISCUSSED WITH PATIENT AGAIN.  NO CHANGES FROM INITIAL HISTORY AND PHYSICAL NOTED.    

## 2019-01-12 NOTE — Anesthesia Procedure Notes (Signed)
Spinal  Patient location during procedure: OR Start time: 01/12/2019 9:35 AM End time: 01/12/2019 9:59 AM Staffing Anesthesiologist: Carroll, Paul, MD Resident/CRNA: Michelet, Stephanie, CRNA Performed: anesthesiologist and resident/CRNA  Preanesthetic Checklist Completed: patient identified, site marked, surgical consent, pre-op evaluation, timeout performed, IV checked, risks and benefits discussed and monitors and equipment checked Spinal Block Patient position: sitting Prep: ChloraPrep Patient monitoring: heart rate, continuous pulse ox, blood pressure and cardiac monitor Approach: midline Location: L3-4 Injection technique: single-shot Needle Needle type: Whitacre  Needle gauge: 22 G Needle length: 9 cm Assessment Sensory level: T10 Additional Notes Negative paresthesia. Negative blood return. Positive free-flowing CSF. Expiration date of kit checked and confirmed. Patient tolerated procedure well, without complications. Attempts X2 by CRNA. Attempts X2 by Dr Carroll      

## 2019-01-12 NOTE — Op Note (Signed)
Expand All Collapse All     01/12/2019  11:37 AM  PATIENT:  Jesse Wagner   MRN: [id number]  PRE-OPERATIVE DIAGNOSIS:  Right tibial fracture  POST-OPERATIVE DIAGNOSIS:  Right tibial fracture  PROCEDURE:  Procedure(s): INTRAMEDULLARY (IM) NAIL TIBIAL  SURGEON:  Labaron Digirolamo E Kessler Solly, MD  ANESTHESIA:   Spinal   PREOPERATIVE INDICATIONS:  The patient  has a diagnosis of displaced and unstable tibia/ fibula fractures who elected for surgical management after discussion with the patient   about the options between surgery and cast management of the fractures. .  The risks benefits and alternatives were discussed with the patient preoperatively including but not limited to the risks of infection, bleeding, nerve injury, cardiopulmonary complications, the need for revision surgery, among others, and the patient was willing to proceed.  OPERATIVE IMPLANTS: Biomet Versanail,   9 mm    330 mm  OPERATIVE FINDINGS: Displaced spiral fracture  COMPLICATIONS: None  EBL: 50      REPLACED: None  OPERATIVE PROCEDURE: The patient was brought to the operating room and underwent spinal anesthesia without complications and then placed on the operating room table and positioned appropriately.  The operative leg was prepped and draped in a sterile fashion.  Tourniquet was not used.  IV anti-biotics were given.  The leg was placed on the tibial reduction triangle and the foot immobilized with Coban and traction and rotational corrections were made.  A proximal medial incision was made along the patellar tendon.  Dissection was carried out bluntly through subcutaneous tissue and the fascia was divided.  A guidepin was introduced into the proximal tibia under fluoroscopic control and seen to be in good alignment and position.  Large drill was introduced to open the the proximal tibia.  A guidepin was then passed down the shaft of the tibia and across the fracture site down to the lower end of the tibia.  The shaft  was then sequentially reamed to  10.5 mm.  The above listed Versanail was introduced and passed across the fracture site and fully seated in the tibia.  Fluoroscopy showed excellent position of the nail and that the fracture had been well reduced.  Length was excellent.  Proximally, fixation was obtained with  one  5.5 screw(s).   Fluoroscopy showed good position and length.  Distally,  One screw  introduced and seated fully. Fluoroscopy showed good position and length.  The wounds were then irrigated.  The knee fascia was closed with 2-0 Vicryls and the subcutaneous tissue was closed with 3-0 Vicryls.  All wounds were closed with staples.  Xeroform covered by dressing sponges and cast padding were applied. Sponge and needle counts were correct.   A well-padded posterior splint was applied.  The patient was transferred to the hospital bed and taken to recovery room in good condition.  Park Breed, MD

## 2019-01-12 NOTE — Anesthesia Procedure Notes (Signed)
Date/Time: 01/12/2019 10:00 AM Performed by: Johnna Acosta, CRNA Pre-anesthesia Checklist: Patient identified, Emergency Drugs available, Suction available, Patient being monitored and Timeout performed Patient Re-evaluated:Patient Re-evaluated prior to induction Oxygen Delivery Method: Simple face mask Preoxygenation: Pre-oxygenation with 100% oxygen

## 2019-01-12 NOTE — Anesthesia Post-op Follow-up Note (Signed)
Anesthesia QCDR form completed.        

## 2019-01-12 NOTE — Transfer of Care (Signed)
Immediate Anesthesia Transfer of Care Note  Patient: Jesse Wagner  Procedure(s) Performed: INTRAMEDULLARY (IM) NAIL TIBIAL (Right )  Patient Location: PACU  Anesthesia Type:Spinal  Level of Consciousness: awake and alert   Airway & Oxygen Therapy: Patient Spontanous Breathing and Patient connected to face mask oxygen  Post-op Assessment: Report given to RN and Post -op Vital signs reviewed and stable  Post vital signs: Reviewed and stable  Last Vitals:  Vitals Value Taken Time  BP 132/83 01/12/19 1256  Temp 36.7 C 01/12/19 1254  Pulse 68 01/12/19 1256  Resp 16 01/12/19 1256  SpO2 100 % 01/12/19 1256  Vitals shown include unvalidated device data.  Last Pain:  Vitals:   01/12/19 1254  TempSrc:   PainSc: 0-No pain      Patients Stated Pain Goal: 5 (84/16/60 6301)  Complications: No apparent anesthesia complications
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
## 2019-01-12 NOTE — Anesthesia Postprocedure Evaluation (Deleted)
Anesthesia Post Note  Patient: Lowell Guitar  Procedure(s) Performed: INTRAMEDULLARY (IM) NAIL TIBIAL (Right )  Patient location during evaluation: PACU Anesthesia Type: Spinal Level of consciousness: awake and alert Pain management: pain level controlled Vital Signs Assessment: post-procedure vital signs reviewed and stable Respiratory status: spontaneous breathing and respiratory function stable Cardiovascular status: stable Anesthetic complications: no     Last Vitals:  Vitals:   01/12/19 1247 01/12/19 1254  BP: 112/76 132/83  Pulse: (!) 59 73  Resp: 13 17  Temp:  36.7 C  SpO2: 100% 100%    Last Pain:  Vitals:   01/12/19 1254  TempSrc:   PainSc: 0-No pain                 [place] Natarajan K

## 2019-01-13 LAB — BASIC METABOLIC PANEL
Anion gap: 8 (ref 5–15)
BUN: 8 mg/dL (ref 6–20)
CO2: 29 mmol/L (ref 22–32)
Calcium: 8.6 mg/dL — ABNORMAL LOW (ref 8.9–10.3)
Chloride: 100 mmol/L (ref 98–111)
Creatinine, Ser: 1.13 mg/dL (ref 0.61–1.24)
GFR calc Af Amer: >60 mL/min (ref >60)
GFR calc non Af Amer: >60 mL/min (ref >60)
Glucose, Bld: 120 mg/dL — ABNORMAL HIGH (ref 70–99)
Potassium: 4 mmol/L (ref 3.5–5.1)
Sodium: 137 mmol/L (ref 135–145)

## 2019-01-13 LAB — HIV ANTIBODY (ROUTINE TESTING W REFLEX): HIV Screen 4th Generation wRfx: NONREACTIVE

## 2019-01-13 MED ORDER — HYDROCODONE-ACETAMINOPHEN 5-325 MG PO TABS: 1.0000 | ORAL_TABLET | ORAL | 0 refills | Status: AC | PRN

## 2019-01-13 NOTE — TOC Transition Note (Signed)
Transition of Care [facility]) - CM/SW Discharge Note   Patient Details  Name: Jesse Wagner MRN: [id number] Date of Birth: 08/28/1985  Transition of [facility] CM/SW Contact:  Jesse Wagner, Jesse Wagner Phone Number: [phone]  01/13/2019, 12:53 PM   Clinical Narrative: Clinical Social Worker (CSW) met with patient to discuss D/C plan. Patient was alert and oriented X4 and was laying in the bed. CSW introduced self and explained role of CSW department. Patient was talking to his mother Jesse Wagner on the phone and put her on speaker phone during assessment. Per patient he lives in [place] with his wife Jesse Wagner and 3 minor children. Patient reported that he has twins age 35 and a 35 y.o as well. Patient reported that he works at [employer] and has [employer]. CSW explained home health and provided patient with CMS home health list. Patient is agreeable to home health and does not have a preference of home health agency. Per Jesse Wagner [facility] representative they can accept patient for PT and their aide will be back from [facility] next week. Patient is agreeable to [facility] home [facility]. Jesse Wagner is aware that patient will D/C home today. Patient reported that he needs crutches and PT is recommending crutches. Per Jesse Wagner [employer] agency representative he will arrange for crutches to be delivered to patient's house today between 2 pm and 6 pm. Patient reported that he has some other crutches that will be able to help him get in the house. Patient is aware of above. RN aware of above. Please reconsult if future social work needs arise. CSW signing off.      Final next level of care: [place] Barriers to Discharge: Barriers Resolved   Patient Goals and CMS Choice Patient states their goals for this hospitalization and ongoing recovery are:: To go home. CMS Medicare.gov Compare Post Acute Care list provided to:: Patient Choice offered to / list presented to : Patient  Discharge  Placement                       Discharge Plan and Services In-house Referral: Clinical Social Work Discharge Planning Services: CM Consult Post Acute Care Choice: Home Health          DME Arranged: Crutches DME Agency: [employer] Date DME Agency Contacted: 01/13/19 Time DME Agency Contacted: 1209 Representative spoke with at DME Agency: Jesse Wagner: PT, Nurse's Aide [place] Agency: [facility] [employer] [facility] [employer] Date [place]: 01/13/19 Time [place]: 1211 Representative spoke with at [place]: [place] ([place]) Interventions     Readmission Risk Interventions No flowsheet data found.

## 2019-01-13 NOTE — Progress Notes (Signed)
Pt. Discharged to home via private vehicle. Discharge instructions and medication regimen reviewed at bedside with patient. Pt. verbalizes understanding of instructions and medication regimen. Prescriptions sent to pharmacy. Patient assessment unchanged from this morning. IV discontinued per policy.  

## 2019-01-13 NOTE — Anesthesia Postprocedure Evaluation (Signed)
Anesthesia Post Note  Patient: Jesse Wagner  Procedure(s) Performed: INTRAMEDULLARY (IM) NAIL TIBIAL (Right )  Patient location during evaluation: Nursing Unit Anesthesia Type: Spinal Level of consciousness: awake and alert and oriented Pain management: pain level controlled Vital Signs Assessment: post-procedure vital signs reviewed and stable Respiratory status: respiratory function stable Cardiovascular status: stable Postop Assessment: no backache, no headache, patient able to bend at knees, no apparent nausea or vomiting, able to ambulate and adequate PO intake Anesthetic complications: no     Last Vitals:  Vitals:   01/13/19 0412 01/13/19 0734  BP: (!) 147/89 (!) 155/95  Pulse: 79 78  Resp: 18 17  Temp: 36.9 C 36.7 C  SpO2: 100% 99%    Last Pain:  Vitals:   01/13/19 0734  TempSrc: Oral  PainSc:                  Lanora Manis

## 2019-01-13 NOTE — Evaluation (Signed)
Physical Therapy Evaluation Patient Details Name: Jesse Wagner Berish MRN: [id number] DOB: 03/14/1986 Today's Date: 01/13/2019   History of Present Illness  33 years old male admitted on 08/24 with R tibial fracture and R proximal fibula displaced due to a mechanical fall. Now s/p: R interamedullary nail tibial.  Clinical Impression  Prior to hospital admission, pt was independent.  Pt lives with his wife in a two level house with 3 stairs entry and both sides of the railings.  Currently pt is SBA to mod I with transfer to ambulation but mod I throughout the bed mobilities and stairs x4. Vc's provided for crutches management and foot/hand placement and sequencing throughout the session. Pt demonstrated good understanding for instructions given. Pt would benefit from skilled PT to address noted impairments and functional limitations (see below for any additional details).  Based on pt has R LE surgeries and limited the ability of driving to outpatient clinic as well as limited assistance at home per pt.  Upon hospital discharge,  recommend pt discharge to HHPT to improve balance, ROM, and strength for functional mobility and ADLs.     Follow Up Recommendations Home health PT    Equipment Recommendations  Crutches    Recommendations for Other Services       Precautions / Restrictions Precautions Precautions: Fall Restrictions Weight Bearing Restrictions: Yes RLE Weight Bearing: Non weight bearing      Mobility  Bed Mobility Overal bed mobility: Modified Independent Bed Mobility: Supine to Sit;Sit to Supine     Supine to sit: Modified independent (Device/Increase time) Sit to supine: Modified independent (Device/Increase time)   General bed mobility comments: increase time and effort  Transfers Overall transfer level: Needs assistance Equipment used: Crutches Transfers: Sit to/from Stand Sit to Stand: Supervision;Modified independent (Device/Increase time)         General  transfer comment: vc's provided for hand and foot placement with crutches. Able to stand up safely with mild postural sway but no sign of loss of balance.  Ambulation/Gait Ambulation/Gait assistance: Modified independent (Device/Increase time);Supervision Gait Distance (Feet): (90 feet + stairs + 170 feet)     Gait velocity: decrease   General Gait Details: Vc's provided for foot placement with crutches and able to ambualte with decreased pain at 4/10  Stairs Stairs: Yes Stairs assistance: Modified independent (Device/Increase time) Stair Management: Two rails Number of Stairs: 4 General stair comments: Vc's and demonstrated the hand and foot sequencing with stairs x 4. Pt demonstraded good understanding of stair negotiation with minimal postural sway.  Wheelchair Mobility    Modified Rankin (Stroke Patients Only)       Balance Overall balance assessment: Modified Independent                                           Pertinent Vitals/Pain Pain Assessment: 0-10 Pain Score: 5  Pain Location: R LE incision Pain Descriptors / Indicators: Constant Pain Intervention(s): Limited activity within patient's tolerance;Monitored during session;Repositioned;Relaxation    Home Living Family/patient expects to be discharged to:: Private residence Living Arrangements: Spouse/significant other;Children Available Help at Discharge: Family;Friend(s) Type of Home: House Home Access: Stairs to enter Entrance Stairs-Rails: Can reach both Entrance Stairs-Number of Steps: 3 Home Layout: Two level Home Equipment: Crutches(Old crutches from his wife)      Prior Function Level of Independence: Independent  Hand Dominance   Dominant Hand: Right    Extremity/Trunk Assessment   Upper Extremity Assessment Upper Extremity Assessment: Overall WFL for tasks assessed(at least 4/5 BUE grossly)    Lower Extremity Assessment Lower Extremity Assessment:  RLE deficits/detail(L LE at least 4/5 grossly.) RLE Deficits / Details: Surgical site; at least 3-/5 with R LE hip flexion    Cervical / Trunk Assessment Cervical / Trunk Assessment: Normal  Communication   Communication: No difficulties  Cognition Arousal/Alertness: Awake/alert Behavior During Therapy: WFL for tasks assessed/performed Overall Cognitive Status: Within Functional Limits for tasks assessed                                        General Comments General comments (skin integrity, edema, etc.): Dressing intact and R LE at beginning/end of the session    Exercises     Assessment/Plan    PT Assessment Patient needs continued PT services  PT Problem List Decreased strength;Decreased range of motion;Decreased activity tolerance;Decreased balance;Decreased cognition;Decreased knowledge of use of DME       PT Treatment Interventions DME instruction;Gait training;Stair training;Functional mobility training;Therapeutic activities;Therapeutic exercise;Balance training    PT Goals (Current goals can be found in the Care Plan section)  Acute Rehab PT Goals Patient Stated Goal: to go home PT Goal Formulation: With patient Time For Goal Achievement: 01/27/19    Frequency BID   Barriers to discharge        Co-evaluation               AM-PAC PT "6 Clicks" Mobility  Outcome Measure Help needed turning from your back to your side while in a flat bed without using bedrails?: None Help needed moving from lying on your back to sitting on the side of a flat bed without using bedrails?: None Help needed moving to and from a bed to a chair (including a wheelchair)?: A Little Help needed standing up from a chair using your arms (e.g., wheelchair or bedside chair)?: A Little Help needed to walk in hospital room?: A Little Help needed climbing 3-5 steps with a railing? : A Little 6 Click Score: 20    End of Session Equipment Utilized During Treatment:  Gait belt Activity Tolerance: Patient tolerated treatment well Patient left: in bed;with call bell/phone within reach;with bed alarm set(BLE heels off and floating off from the pillow (RLE) and rolled towel (L LE)) Nurse Communication: Mobility status;Weight bearing status;Precautions PT Visit Diagnosis: Unsteadiness on feet (R26.81);Other abnormalities of gait and mobility (R26.89);Difficulty in walking, not elsewhere classified (R26.2);History of falling (Z91.81)    Time: 9470-9628 PT Time Calculation (min) (ACUTE ONLY): 46 min   Charges:                Sherrilyn Rist, SPT 01/13/2019, 10:05 AM
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
## 2019-02-05 ENCOUNTER — Other Ambulatory Visit: Payer: Self-pay

## 2019-02-05 ENCOUNTER — Encounter: Payer: Self-pay | Admitting: Family Medicine

## 2019-02-05 ENCOUNTER — Ambulatory Visit (INDEPENDENT_AMBULATORY_CARE_PROVIDER_SITE_OTHER): Payer: Commercial Managed Care - PPO | Admitting: Family Medicine

## 2019-02-05 VITALS — BP 126/80 | HR 93 | Temp 98.1°F | Resp 14 | Ht 71.0 in | Wt 180.2 lb

## 2019-02-05 DIAGNOSIS — Z7689 Persons encountering health services in other specified circumstances: Secondary | ICD-10-CM | POA: Diagnosis not present

## 2019-02-05 DIAGNOSIS — K5900 Constipation, unspecified: Secondary | ICD-10-CM | POA: Diagnosis not present

## 2019-02-05 NOTE — Patient Instructions (Addendum)
Push fiber supplements and fluids If you get really constipated add miralax 1/2 to 1 cap full a day to a cup of fluid  Hemorrhoids Hemorrhoids are swollen veins that may develop:  In the butt (rectum). These are called internal hemorrhoids.  Around the opening of the butt (anus). These are called external hemorrhoids. Hemorrhoids can cause pain, itching, or bleeding. Most of the time, they do not cause serious problems. They usually get better with diet changes, lifestyle changes, and other home treatments. What are the causes? This condition may be caused by:  Having trouble pooping (constipation).  Pushing hard (straining) to poop.  Watery poop (diarrhea).  Pregnancy.  Being very overweight (obese).  Sitting for long periods of time.  Heavy lifting or other activity that causes you to strain.  Anal sex.  Riding a bike for a long period of time. What are the signs or symptoms? Symptoms of this condition include:  Pain.  Itching or soreness in the butt.  Bleeding from the butt.  Leaking poop.  Swelling in the area.  One or more lumps around the opening of your butt. How is this diagnosed? A doctor can often diagnose this condition by looking at the affected area. The doctor may also:  Do an exam that involves feeling the area with a gloved hand (digital rectal exam).  Examine the area inside your butt using a small tube (anoscope).  Order blood tests. This may be done if you have lost a lot of blood.  Have you get a test that involves looking inside the colon using a flexible tube with a camera on the end (sigmoidoscopy or colonoscopy). How is this treated? This condition can usually be treated at home. Your doctor may tell you to change what you eat, make lifestyle changes, or try home treatments. If these do not help, procedures can be done to remove the hemorrhoids or make them smaller. These may involve:  Placing rubber bands at the base of the hemorrhoids  to cut off their blood supply.  Injecting medicine into the hemorrhoids to shrink them.  Shining a type of light energy onto the hemorrhoids to cause them to fall off.  Doing surgery to remove the hemorrhoids or cut off their blood supply. Follow these instructions at home: Eating and drinking   Eat foods that have a lot of fiber in them. These include whole grains, beans, nuts, fruits, and vegetables.  Ask your doctor about taking products that have added fiber (fibersupplements).  Reduce the amount of fat in your diet. You can do this by: ? Eating low-fat dairy products. ? Eating less red meat. ? Avoiding processed foods.  Drink enough fluid to keep your pee (urine) pale yellow. Managing pain and swelling   Take a warm-water bath (sitz bath) for 20 minutes to ease pain. Do this 3-4 times a day. You may do this in a bathtub or using a portable sitz bath that fits over the toilet.  If told, put ice on the painful area. It may be helpful to use ice between your warm baths. ? Put ice in a plastic bag. ? Place a towel between your skin and the bag. ? Leave the ice on for 20 minutes, 2-3 times a day. General instructions  Take over-the-counter and prescription medicines only as told by your doctor. ? Medicated creams and medicines may be used as told.  Exercise often. Ask your doctor how much and what kind of exercise is best for you.  Go to the bathroom when you have the urge to poop. Do not wait.  Avoid pushing too hard when you poop.  Keep your butt dry and clean. Use wet toilet paper or moist towelettes after pooping.  Do not sit on the toilet for a long time.  Keep all follow-up visits as told by your doctor. This is important. Contact a doctor if you:  Have pain and swelling that do not get better with treatment or medicine.  Have trouble pooping.  Cannot poop.  Have pain or swelling outside the area of the hemorrhoids. Get help right away if you  have:  Bleeding that will not stop. Summary  Hemorrhoids are swollen veins in the butt or around the opening of the butt.  They can cause pain, itching, or bleeding.  Eat foods that have a lot of fiber in them. These include whole grains, beans, nuts, fruits, and vegetables.  Take a warm-water bath (sitz bath) for 20 minutes to ease pain. Do this 3-4 times a day. This information is not intended to replace advice given to you by your health care provider. Make sure you discuss any questions you have with your health care provider. Document Released: 02/13/2008 Document Revised: 05/14/2018 Document Reviewed: 09/25/2017 Elsevier Patient Education  2020 [employer]   PLEASE CALL us OR [facility] IF YOU ARE HAVING LEG SWELLING OR PAIN OR REDNESS THAT IS CONSTANT AND WORSENING.     Deep Vein Thrombosis  Deep vein thrombosis (DVT) is a condition in which a blood clot forms in a deep vein, such as a lower leg, thigh, or arm vein. A clot is blood that has thickened into a gel or solid. This condition is dangerous. It can lead to serious and even life-threatening complications if the clot travels to the lungs and causes a blockage (pulmonary embolism). It can also damage veins in the leg. This can result in leg pain, swelling, discoloration, and sores (post-thrombotic syndrome). What are the causes? This condition may be caused by:  A slowdown of blood flow.  Damage to a vein.  A condition that causes blood to clot more easily, such as an inherited clotting disorder. What increases the risk? The following factors may make you more likely to develop this condition:  Being overweight.  Being older, especially over age 31.  Sitting or lying down for more than four hours.  Being in the hospital.  Lack of physical activity (sedentary lifestyle).  Pregnancy, being in childbirth, or having recently given birth.  Taking medicines that contain estrogen, such as medicines to prevent  pregnancy.  Smoking.  A history of any of the following: ? Blood clots or a blood clotting disease. ? Peripheral vascular disease. ? Inflammatory bowel disease. ? Cancer. ? Heart disease. ? Genetic conditions that affect how your blood clots, such as Factor V Leiden mutation. ? Neurological diseases that affect your legs (leg paresis). ? A recent injury, such as a car accident. ? Major or lengthy surgery. ? A central line placed inside a large vein. What are the signs or symptoms? Symptoms of this condition include:  Swelling, pain, or tenderness in an arm or leg.  Warmth, redness, or discoloration in an arm or leg. If the clot is in your leg, symptoms may be more noticeable or worse when you stand or walk. Some people may not develop any symptoms. How is this diagnosed? This condition is diagnosed with:  A medical history and physical exam.  Tests, such as: ? Blood  tests. These are done to check how well your blood clots. ? Ultrasound. This is done to check for clots. ? Venogram. For this test, contrast dye is injected into a vein and X-rays are taken to check for any clots. How is this treated? Treatment for this condition depends on:  The cause of your DVT.  Your risk for bleeding or developing more clots.  Any other medical conditions that you have. Treatment may include:  Taking a blood thinner (anticoagulant). This type of medicine prevents clots from forming. It may be taken by mouth, injected under the skin, or injected through an IV (catheter).  Injecting clot-dissolving medicines into the affected vein (catheter-directed thrombolysis).  Having surgery. Surgery may be done to: ? Remove the clot. ? Place a filter in a large vein to catch blood clots before they reach the lungs. Some treatments may be continued for up to six months. Follow these instructions at home: If you are taking blood thinners:  Take the medicine exactly as told by your health care  provider. Some blood thinners need to be taken at the same time every day. Do not skip a dose.  Talk with your health care provider before you take any medicines that contain aspirin or NSAIDs. These medicines increase your risk for dangerous bleeding.  Ask your health care provider about foods and drugs that could change the way the medicine works (may interact). Avoid those things if your health care provider tells you to do so.  Blood thinners can cause easy bruising and may make it difficult to stop bleeding. Because of this: ? Be very careful when using knives, scissors, or other sharp objects. ? Use an electric razor instead of a blade. ? Avoid activities that could cause injury or bruising, and follow instructions about how to prevent falls.  Wear a medical alert bracelet or carry a card that lists what medicines you take. General instructions  Take over-the-counter and prescription medicines only as told by your health care provider.  Return to your normal activities as told by your health care provider. Ask your health care provider what activities are safe for you.  Wear compression stockings if recommended by your health care provider.  Keep all follow-up visits as told by your health care provider. This is important. How is this prevented? To lower your risk of developing this condition again:  For 30 or more minutes every day, do an activity that: ? Involves moving your arms and legs. ? Increases your heart rate.  When traveling for longer than four hours: ? Exercise your arms and legs every hour. ? Drink plenty of water. ? Avoid drinking alcohol.  Avoid sitting or lying for a long time without moving your legs.  If you have surgery or you are hospitalized, ask about ways to prevent blood clots. These may include taking frequent walks or using anticoagulants.  Stay at a healthy weight.  If you are a woman who is older than age 43, avoid unnecessary use of medicines  that contain estrogen, such as some birth control pills.  Do not use any products that contain nicotine or tobacco, such as cigarettes and e-cigarettes. This is especially important if you take estrogen medicines. If you need help quitting, ask your health care provider. Contact a health care provider if:  You miss a dose of your blood thinner.  Your menstrual period is heavier than usual.  You have unusual bruising. Get help right away if:  You have: ? New  or increased pain, swelling, or redness in an arm or leg. ? Numbness or tingling in an arm or leg. ? Shortness of breath. ? Chest pain. ? A rapid or irregular heartbeat. ? A severe headache or confusion. ? A cut that will not stop bleeding.  There is blood in your vomit, stool, or urine.  You have a serious fall or accident, or you hit your head.  You feel light-headed or dizzy.  You cough up blood. These symptoms may represent a serious problem that is an emergency. Do not wait to see if the symptoms will go away. Get medical help right away. Call your local emergency services (911 in the [place] Do not drive yourself to the hospital. Summary  Deep vein thrombosis (DVT) is a condition in which a blood clot forms in a deep vein, such as a lower leg, thigh, or arm vein.  Symptoms can include swelling, warmth, pain, and redness in your leg or arm.  This condition may be treated with a blood thinner (anticoagulant medicine), medicine that is injected to dissolve blood clots,compression stockings, or surgery.  If you are prescribed blood thinners, take them exactly as told. This information is not intended to replace advice given to you by your health care provider. Make sure you discuss any questions you have with your health care provider. Document Released: 05/06/2005 Document Revised: 04/18/2017 Document Reviewed: 10/04/2016 [employer] Patient Education  2020 [employer]

## 2019-02-05 NOTE — Progress Notes (Signed)
Name: Jesse RodCharles Garbutt   MRN: [id number]    DOB: 01/02/1986   Date:02/05/2019       Progress Note  Chief Complaint  Patient presents with  . Establish Care  . Hemorrhoids    off and on bleeding when he wipes after bowel movement     Subjective:   Jesse Wagner is a 33 y.o. male, presents to clinic to establish care  New to establish care, but he is a prior pt here of Dr. Carlynn PurlSowles.  Cannot find any old visits or records pertaining to this.  He recently had right tibial fx requiring surgery, he has rod, is in a boot.  He is working with Ortho, he is anxious to get back to work has to be fully weight-bearing and currently is still on crutches and supposed to be nonweightbearing although it it appears she has been pushing himself.  Had some elevated BP in the past, but blood pressure is good today.  He does have past medical history of hemorrhoids off and on that he believes have been caused by intermittent constipation.  He currently has normal bowel movements he is not straining or having pain, no blood in his stool or in the toilet or on toilet paper.  Patient Active Problem List   Diagnosis Date Noted  . Right tibial fracture 01/11/2019    Past Surgical History:  Procedure Laterality Date  . TIBIA IM NAIL INSERTION Right 01/12/2019   Procedure: INTRAMEDULLARY (IM) NAIL TIBIAL;  Surgeon: Deeann SaintMiller, Howard, MD;  Location: [facility] ORS;  Service: Orthopedics;  Laterality: Right;    Family History  Problem Relation Age of Onset  . Breast cancer Mother   . Hypertension Mother   . Asthma Son     Social History   Socioeconomic History  . Marital status: Married    Spouse name: Not on file  . Number of children: 3  . Years of education: 6412  . Highest education level: Not on file  Occupational History  . Not on file  Social Needs  . Financial resource strain: Not hard at all  . Food insecurity    Worry: Never true    Inability: Never true  . Transportation needs    Medical: No     Non-medical: No  Tobacco Use  . Smoking status: Never Smoker  . Smokeless tobacco: Never Used  Substance and Sexual Activity  . Alcohol use: Never    Frequency: Never  . Drug use: Never  . Sexual activity: Yes  Lifestyle  . Physical activity    Days per week: 3 days    Minutes per session: 60 min  . Stress: Not at all  Relationships  . Social connections    Talks on phone: More than three times a week    Gets together: More than three times a week    Attends religious service: Never    Active member of club or organization: No    Attends meetings of clubs or organizations: Never    Relationship status: Married  . Intimate partner violence    Fear of current or ex partner: No    Emotionally abused: No    Physically abused: No    Forced sexual activity: No  Other Topics Concern  . Not on file  Social History Narrative  . Not on file     Current Outpatient Medications:  .  cyclobenzaprine (FLEXERIL) 10 MG tablet, Take 1 tablet (10 mg total) by mouth 3 (three) times daily as  needed. (Patient taking differently: Take 10 mg by mouth 3 (three) times daily as needed for muscle spasms. ), Disp: 30 tablet, Rfl: 0 .  HYDROcodone-acetaminophen (NORCO/VICODIN) 5-325 MG tablet, Take 1 tablet by mouth every 4 (four) hours as needed for moderate pain., Disp: 22 tablet, Rfl: 0 .  meloxicam (MOBIC) 15 MG tablet, Take 1 tablet (15 mg total) by mouth daily. (Patient taking differently: Take 15 mg by mouth daily as needed for pain. ), Disp: 30 tablet, Rfl: 2  No Known Allergies  I personally reviewed active problem list, medication list, allergies, family history, social history, health maintenance, notes from last encounter, lab results with the patient/caregiver today.  Review of Systems  Constitutional: Negative.   HENT: Negative.   Eyes: Negative.   Respiratory: Negative.   Cardiovascular: Negative.   Gastrointestinal: Negative.   Endocrine: Negative.   Genitourinary: Negative.    Musculoskeletal: Negative.   Skin: Negative.   Allergic/Immunologic: Negative.   Neurological: Negative.   Hematological: Negative.   Psychiatric/Behavioral: Negative.   All other systems reviewed and are negative.    Objective:    Vitals:   02/05/19 1513  BP: 126/80  Pulse: 93  Resp: 14  Temp: 98.1 F (36.7 C)  SpO2: 99%  Weight: 180 lb 3.2 oz (81.7 kg)  Height: 5\' 11"  (1.803 m)    Body mass index is 25.13 kg/m.  Physical Exam Vitals signs and nursing note reviewed.  Constitutional:      General: He is not in acute distress.    Appearance: Normal appearance. He is well-developed. He is not ill-appearing, toxic-appearing or diaphoretic.  HENT:     Head: Normocephalic and atraumatic.     Jaw: No trismus.     Right Ear: Tympanic membrane, ear canal and external ear normal.     Left Ear: Tympanic membrane, ear canal and external ear normal.     Nose: No mucosal edema or rhinorrhea.     Right Sinus: No maxillary sinus tenderness or frontal sinus tenderness.     Left Sinus: No maxillary sinus tenderness or frontal sinus tenderness.     Mouth/Throat:     Pharynx: Uvula midline. No oropharyngeal exudate, posterior oropharyngeal erythema or uvula swelling.  Eyes:     General: Lids are normal.     Conjunctiva/sclera: Conjunctivae normal.     Pupils: Pupils are equal, round, and reactive to light.  Neck:     Musculoskeletal: Normal range of motion and neck supple.     Trachea: Trachea and phonation normal. No tracheal deviation.  Cardiovascular:     Rate and Rhythm: Regular rhythm.     Pulses: Normal pulses.          Radial pulses are 2+ on the right side and 2+ on the left side.       Posterior tibial pulses are 2+ on the right side and 2+ on the left side.     Heart sounds: Normal heart sounds. No murmur. No friction rub. No gallop.   Pulmonary:     Effort: Pulmonary effort is normal.     Breath sounds: Normal breath sounds. No wheezing, rhonchi or rales.   Abdominal:     General: Bowel sounds are normal. There is no distension.     Palpations: Abdomen is soft.     Tenderness: There is no abdominal tenderness. There is no guarding or rebound.  Musculoskeletal:     Comments: Right boot, using crutches  Skin:    General: Skin is warm  and dry.     Capillary Refill: Capillary refill takes less than 2 seconds.     Findings: No rash.  Neurological:     Mental Status: He is alert and oriented to person, place, and time.  Psychiatric:        Speech: Speech normal.        Behavior: Behavior normal.      Recent Results (from the past 2160 hour(s))  Basic metabolic panel     Status: Abnormal   Collection Time: 01/11/19  9:37 AM  Result Value Ref Range   Sodium 136 135 - 145 mmol/L   Potassium 4.1 3.5 - 5.1 mmol/L   Chloride 102 98 - 111 mmol/L   CO2 26 22 - 32 mmol/L   Glucose, Bld 147 (H) 70 - 99 mg/dL   BUN 15 6 - 20 mg/dL   Creatinine, Ser 5.99 0.61 - 1.24 mg/dL   Calcium 9.0 8.9 - 35.7 mg/dL   GFR calc non Af Amer >60 >60 mL/min   GFR calc Af Amer >60 >60 mL/min   Anion gap 8 5 - 15    Comment: Performed at [facility], [address] [place], [place] [address]  CBC with Differential     Status: Abnormal   Collection Time: 01/11/19  9:37 AM  Result Value Ref Range   WBC 9.5 4.0 - 10.5 K/uL   RBC 5.97 (H) 4.22 - 5.81 MIL/uL   Hemoglobin 14.0 13.0 - 17.0 g/dL   HCT 39.0 30.0 - 92.3 %   MCV 74.7 (L) 80.0 - 100.0 fL   MCH 23.5 (L) 26.0 - 34.0 pg   MCHC 31.4 30.0 - 36.0 g/dL   RDW 30.0 76.2 - 26.3 %   Platelets 279 150 - 400 K/uL   nRBC 0.0 0.0 - 0.2 %   Neutrophils Relative % 82 %   Neutro Abs 7.8 (H) 1.7 - 7.7 K/uL   Lymphocytes Relative 12 %   Lymphs Abs 1.1 0.7 - 4.0 K/uL   Monocytes Relative 6 %   Monocytes Absolute 0.6 0.1 - 1.0 K/uL   Eosinophils Relative 0 %   Eosinophils Absolute 0.0 0.0 - 0.5 K/uL   Basophils Relative 0 %   Basophils Absolute 0.0 0.0 - 0.1 K/uL   Immature Granulocytes 0 %   Abs  Immature Granulocytes 0.03 0.00 - 0.07 K/uL    Comment: Performed at [facility], [address] [place], [place] [address]  Protime-INR     Status: None   Collection Time: 01/11/19 10:53 AM  Result Value Ref Range   Prothrombin Time 13.0 11.4 - 15.2 seconds   INR 1.0 0.8 - 1.2    Comment: (NOTE) INR goal varies based on device and disease states. Performed at [facility], [address] [place], [place] [address]   SARS Coronavirus 2 [facility] order, Performed in [facility] hospital lab)     Status: None   Collection Time: 01/11/19 10:53 AM  Result Value Ref Range   SARS Coronavirus 2 NEGATIVE NEGATIVE    Comment: (NOTE) If result is NEGATIVE SARS-CoV-2 target nucleic acids are NOT DETECTED. The SARS-CoV-2 RNA is generally detectable in upper and lower  respiratory specimens during the acute phase of infection. The lowest  concentration of SARS-CoV-2 viral copies this assay can detect is 250  copies / mL. A negative result does not preclude SARS-CoV-2 infection  and should not be used as the sole basis for treatment or other  patient  management decisions.  A negative result may occur with  improper specimen collection / handling, submission of specimen other  than nasopharyngeal swab, presence of viral mutation(s) within the  areas targeted by this assay, and inadequate number of viral copies  (<250 copies / mL). A negative result must be combined with clinical  observations, patient history, and epidemiological information. If result is POSITIVE SARS-CoV-2 target nucleic acids are DETECTED. The SARS-CoV-2 RNA is generally detectable in upper and lower  respiratory specimens dur ing the acute phase of infection.  Positive  results are indicative of active infection with SARS-CoV-2.  Clinical  correlation with patient history and other diagnostic information is  necessary to determine patient infection status.  Positive results do  not rule out bacterial  infection or co-infection with other viruses. If result is PRESUMPTIVE POSTIVE SARS-CoV-2 nucleic acids MAY BE PRESENT.   A presumptive positive result was obtained on the submitted specimen  and confirmed on repeat testing.  While 2019 novel coronavirus  (SARS-CoV-2) nucleic acids may be present in the submitted sample  additional confirmatory testing may be necessary for epidemiological  and / or clinical management purposes  to differentiate between  SARS-CoV-2 and other Sarbecovirus currently known to infect humans.  If clinically indicated additional testing with an alternate test  methodology [phone]) is advised. The SARS-CoV-2 RNA is generally  detectable in upper and lower respiratory sp ecimens during the acute  phase of infection. The expected result is Negative. Fact Sheet for Patients:  StrictlyIdeas.no Fact Sheet for Healthcare Providers: BankingDealers.co.za This test is not yet approved or cleared by the [place] FDA and has been authorized for detection and/or diagnosis of SARS-CoV-2 by FDA under an Emergency Use Authorization (EUA).  This EUA will remain in effect (meaning this test can be used) for the duration of the COVID-19 declaration under Section 564(b)(1) of the Act, 21 U.S.C. section 360bbb-3(b)(1), unless the authorization is terminated or revoked sooner. Performed at [facility], [address] [place], [place] [address]   Type and screen     Status: None   Collection Time: 01/11/19 10:54 AM  Result Value Ref Range   ABO/RH(D) O POS    Antibody Screen NEG    Sample Expiration      01/14/2019,2359 Performed at [facility], [address], [place], [place] [address]   Surgical PCR screen     Status: None   Collection Time: 01/11/19  4:23 PM   Specimen: Nasal Mucosa; Nasal Swab  Result Value Ref Range   MRSA, PCR NEGATIVE NEGATIVE   Staphylococcus aureus NEGATIVE NEGATIVE     Comment: (NOTE) The Xpert SA Assay (FDA approved for NASAL specimens in patients 73 years of age and older), is one component of a comprehensive surveillance program. It is not intended to diagnose infection nor to guide or monitor treatment. Performed at [facility], [address] [place], [place] [address]   HIV antibody (Routine Testing)     Status: None   Collection Time: 01/12/19  3:51 AM  Result Value Ref Range   HIV Screen 4th Generation wRfx Non Reactive Non Reactive    Comment: (NOTE) Performed At: [facility] [place], [place] [id number] Rush Farmer MD PP:[id number]   CBC     Status: Abnormal   Collection Time: 01/12/19  3:51 AM  Result Value Ref Range   WBC 6.9 4.0 - 10.5 K/uL   RBC 5.50 4.22 - 5.81 MIL/uL   Hemoglobin 13.0  13.0 - 17.0 g/dL   HCT 16.1 09.6 - 04.5 %   MCV 77.6 (L) 80.0 - 100.0 fL   MCH 23.6 (L) 26.0 - 34.0 pg   MCHC 30.4 30.0 - 36.0 g/dL   RDW 40.9 (H) 81.1 - 91.4 %   Platelets 263 150 - 400 K/uL   nRBC 0.0 0.0 - 0.2 %    Comment: Performed at [facility], [address] [place], [place] [address]  Creatinine, serum     Status: None   Collection Time: 01/12/19  3:51 AM  Result Value Ref Range   Creatinine, Ser 1.13 0.61 - 1.24 mg/dL   GFR calc non Af Amer >60 >60 mL/min   GFR calc Af Amer >60 >60 mL/min    Comment: Performed at [facility], [address] [place], [place] [address]  Basic metabolic panel     Status: Abnormal   Collection Time: 01/13/19  5:39 AM  Result Value Ref Range   Sodium 137 135 - 145 mmol/L   Potassium 4.0 3.5 - 5.1 mmol/L   Chloride 100 98 - 111 mmol/L   CO2 29 22 - 32 mmol/L   Glucose, Bld 120 (H) 70 - 99 mg/dL   BUN 8 6 - 20 mg/dL   Creatinine, Ser 8.65 0.61 - 1.24 mg/dL   Calcium 8.6 (L) 8.9 - 10.3 mg/dL   GFR calc non Af Amer >60 >60 mL/min   GFR calc Af Amer >60 >60 mL/min   Anion gap 8 5 - 15    Comment: Performed at [facility], [address] [place], [place] [address]     PHQ2/9: Depression screen [facility] 2/9 02/05/2019  Decreased Interest 0  Down, Depressed, Hopeless 0  PHQ - 2 Score 0  Altered sleeping 0  Tired, decreased energy 0  Change in appetite 0  Feeling bad or failure about yourself  0  Trouble concentrating 0  Moving slowly or fidgety/restless 0  Suicidal thoughts 0  PHQ-9 Score 0  Difficult doing work/chores Not difficult at all    phq 9 is negative, reviewed   Fall Risk: Fall Risk  02/05/2019  Falls in the past year? 0  Number falls in past yr: 0  Injury with Fall? 0      Functional Status Survey: Is the patient deaf or have difficulty hearing?: No Does the patient have difficulty seeing, even when wearing glasses/contacts?: No Does the patient have difficulty concentrating, remembering, or making decisions?: No Does the patient have difficulty walking or climbing stairs?: No Does the patient have difficulty dressing or bathing?: No Does the patient have difficulty doing errands alone such as visiting a doctor's office or shopping?: No    Assessment & Plan:   Patient is 33 year old male here to establish care  He states he is a there are more patient here but cannot find any records to validate this have asked him to sign for any other medical records from past PCPs  Reviewed his recent hospitalization labs and blood pressures, had some elevated blood pressure which returned to normal after pain was managed, his blood pressure is good here today.  He also had some elevated blood sugar in some of his recent lab work but I do not know if these labs were fasting or not.  Patient defers any lab work today and states he will return for complete physical or we can do screening lab work.  1. Encounter to establish care with new doctor Requesting  records - reviewed all available records - pt to return for CPE  2. Constipation, unspecified constipation type Currently doing well, discussed  conservative management  3.  Right tibial fx Managed by Dr. Hyacinth MeekerMiller - encouraged him to follow post-op instructions for best outcomes.  Some swelling to his right leg have encouraged him to monitor and if any redness or worsening needs to be evaluated to rule out DVT but we did discuss how there is usually swelling and with postop changes and surgeries like this he needs to be very compliant with orthopedics instructions so that he can heal and recover and get back to work  Return for Annual Physical  -  whenever he would like to come back to do CPE with fasting labs.   Danelle BerryLeisa Jozalynn Noyce, PA-C 02/05/19 3:26 PM

## 2019-02-24 ENCOUNTER — Other Ambulatory Visit: Payer: Self-pay

## 2019-02-24 DIAGNOSIS — Z20822 Contact with and (suspected) exposure to covid-19: Secondary | ICD-10-CM

## 2019-02-25 LAB — NOVEL CORONAVIRUS, NAA: SARS-CoV-2, NAA: NOT DETECTED

## 2019-03-08 ENCOUNTER — Other Ambulatory Visit: Payer: Self-pay

## 2019-03-11 ENCOUNTER — Other Ambulatory Visit: Payer: Self-pay

## 2019-03-11 DIAGNOSIS — Z20822 Contact with and (suspected) exposure to covid-19: Secondary | ICD-10-CM

## 2019-03-13 LAB — NOVEL CORONAVIRUS, NAA: SARS-CoV-2, NAA: NOT DETECTED

## 2019-03-15 ENCOUNTER — Encounter: Payer: Self-pay | Admitting: Family Medicine

## 2019-03-15 ENCOUNTER — Ambulatory Visit (INDEPENDENT_AMBULATORY_CARE_PROVIDER_SITE_OTHER): Payer: Commercial Managed Care - PPO | Admitting: Family Medicine

## 2019-03-15 ENCOUNTER — Other Ambulatory Visit: Payer: Self-pay

## 2019-03-15 VITALS — BP 124/74 | HR 70 | Temp 98.1°F | Resp 14 | Ht 71.0 in | Wt 193.6 lb

## 2019-03-15 DIAGNOSIS — Z130 Encounter for screening for diseases of the blood and blood-forming organs and certain disorders involving the immune mechanism: Secondary | ICD-10-CM | POA: Diagnosis not present

## 2019-03-15 DIAGNOSIS — Z13228 Encounter for screening for other metabolic disorders: Secondary | ICD-10-CM

## 2019-03-15 DIAGNOSIS — Z0000 Encounter for general adult medical examination without abnormal findings: Secondary | ICD-10-CM | POA: Diagnosis not present

## 2019-03-15 DIAGNOSIS — Z13220 Encounter for screening for lipoid disorders: Secondary | ICD-10-CM

## 2019-03-15 DIAGNOSIS — Z1329 Encounter for screening for other suspected endocrine disorder: Secondary | ICD-10-CM | POA: Diagnosis not present

## 2019-03-15 NOTE — Progress Notes (Signed)
Patient: Jesse Wagner, Male    DOB: 05-01-1986, 33 y.o.   MRN: [id number] Jesse Berry, PA-C Visit Date: 03/15/2019  Today's Provider: Danelle Berry, PA-C   Chief Complaint  Patient presents with  . Annual Exam   Subjective:   Annual physical exam:  Jesse Wagner is a 33 y.o. male who presents today for health maintenance and annual & complete physical exam.  He feels well.  He reports exercising - just had surgery - just got cleared to bear weight. Diet - poor right now, has gained weight He reports he is sleeping fairly well - leg still hurts sometimes interferes with sleep  Increased weight over the last couple months Wt Readings from Last 5 Encounters:  03/15/19 193 lb 9.6 oz (87.8 kg)  02/05/19 180 lb 3.2 oz (81.7 kg)  01/12/19 184 lb (83.5 kg)  12/31/18 185 lb (83.9 kg)   USPSTF grade A and B recommendations - reviewed and addressed today  Depression:  Phq 9 completed today by patient, was reviewed by me with patient in the room, score is  negative, pt feels good PHQ 2/9 Scores 03/15/2019 02/05/2019  PHQ - 2 Score 0 0  PHQ- 9 Score 0 0   Depression screen [facility] 2/9 03/15/2019 02/05/2019  Decreased Interest 0 0  Down, Depressed, Hopeless 0 0  PHQ - 2 Score 0 0  Altered sleeping 0 0  Tired, decreased energy 0 0  Change in appetite 0 0  Feeling bad or failure about yourself  0 0  Trouble concentrating 0 0  Moving slowly or fidgety/restless 0 0  Suicidal thoughts 0 0  PHQ-9 Score 0 0  Difficult doing work/chores Not difficult at all Not difficult at all   STD testing and prevention (HIV/chl/gon/syphilis):  No need, monogomous Prostate cancer: none done for age  No results found for: PSA Intimate partner violence:  Feels safe at home, no abuse  Advanced Care Planning:  A voluntary discussion about advance care planning including the explanation and discussion of advance directives.  Discussed health care proxy and Living will, and the patient was able to  identify a health care proxy as spouse - Jesse Wagner, wife.  Patient does not have a living will at present time. If patient does have living will, I have requested they bring this to the clinic to be scanned in to their chart.  Skin cancer:  None-  last skin survey was.  Pt reports no hx of skin cancer, suspicious lesions/biopsies in the past.   Colorectal cancer:  colonoscopy is not indicated per age  Lung cancer:   Low Dose CT Chest recommended if Age 74-80 years, 30 pack-year currently smoking OR have quit w/in 15years. Patient does not qualify.   Social History   Tobacco Use  . Smoking status: Never Smoker  . Smokeless tobacco: Never Used  Substance Use Topics  . Alcohol use: Never    Frequency: Never   Alcohol screening:   [facility] Visit from [facility]/15/2019 in [facility]  AUDIT-C Score  0     AAA: n/a  The USPSTF recommends one-time screening with ultrasonography in men ages 17 to 77 years who have ever smoked  ECG:  none  Blood pressure/Hypertension: BP Readings from Last 3 Encounters:  03/15/19 124/74  02/05/19 126/80  01/13/19 (!) 163/90   Weight/Obesity: Wt Readings from Last 3 Encounters:  03/15/19 193 lb 9.6 oz (87.8 kg)  02/05/19 180 lb 3.2 oz (81.7 kg)  01/12/19 184 lb (  83.5 kg)   BMI Readings from Last 3 Encounters:  03/15/19 27.00 kg/m  02/05/19 25.13 kg/m  01/12/19 27.98 kg/m    Lipids:  No results found for: CHOL No results found for: HDL No results found for: LDLCALC No results found for: TRIG No results found for: CHOLHDL No results found for: LDLDIRECT Based on the results of lipid panel his/her cardiovascular risk factor ( using [place] Cohort )  in the next 10 years is : The ASCVD Risk score Denman George(Goff DC Jr., et al., 2013) failed to calculate for the following reasons:   The 2013 ASCVD risk score is only valid for ages 6340 to 5379 Glucose:  Glucose, Bld  Date Value Ref Range Status  01/13/2019 120 (H) 70 - 99 mg/dL Final   16/10/960408/24/2020 540147 (H) 70 - 99 mg/dL Final   Social History      He  reports that he has never smoked. He has never used smokeless tobacco. He reports that he does not drink alcohol or use drugs.       Social History   Socioeconomic History  . Marital status: Married    Spouse name: Not on file  . Number of children: 3  . Years of education: 7412  . Highest education level: Not on file  Occupational History  . Not on file  Social Needs  . Financial resource strain: Not hard at all  . Food insecurity    Worry: Never true    Inability: Never true  . Transportation needs    Medical: No    Non-medical: No  Tobacco Use  . Smoking status: Never Smoker  . Smokeless tobacco: Never Used  Substance and Sexual Activity  . Alcohol use: Never    Frequency: Never  . Drug use: Never  . Sexual activity: Yes    Comment: wife tubal  Lifestyle  . Physical activity    Days per week: 3 days    Minutes per session: 60 min  . Stress: Not at all  Relationships  . Social connections    Talks on phone: More than three times a week    Gets together: More than three times a week    Attends religious service: Never    Active member of club or organization: No    Attends meetings of clubs or organizations: Never    Relationship status: Married  Other Topics Concern  . Not on file  Social History Narrative  . Not on file         Social History   Socioeconomic History  . Marital status: Married    Spouse name: Not on file  . Number of children: 3  . Years of education: 4912  . Highest education level: Not on file  Occupational History  . Not on file  Social Needs  . Financial resource strain: Not hard at all  . Food insecurity    Worry: Never true    Inability: Never true  . Transportation needs    Medical: No    Non-medical: No  Tobacco Use  . Smoking status: Never Smoker  . Smokeless tobacco: Never Used  Substance and Sexual Activity  . Alcohol use: Never    Frequency: Never  .  Drug use: Never  . Sexual activity: Yes    Comment: wife tubal  Lifestyle  . Physical activity    Days per week: 3 days    Minutes per session: 60 min  . Stress: Not at all  Relationships  .  Social connections    Talks on phone: More than three times a week    Gets together: More than three times a week    Attends religious service: Never    Active member of club or organization: No    Attends meetings of clubs or organizations: Never    Relationship status: Married  . Intimate partner violence    Fear of current or ex partner: No    Emotionally abused: No    Physically abused: No    Forced sexual activity: No  Other Topics Concern  . Not on file  Social History Narrative  . Not on file    Family History        Family Status  Relation Name Status  . Mother  Deceased at age 38       breast cancer   . Father  Alive  . Sister  Alive  . Brother  Alive  . Son  Alive  . Sister  Alive        His family history includes Alcohol abuse in his father; Asthma in his son; Breast cancer in his mother; Hypertension in his father and mother; Stroke (age of onset: 47) in his father.       Family History  Problem Relation Age of Onset  . Breast cancer Mother   . Hypertension Mother   . Alcohol abuse Father   . Hypertension Father   . Stroke Father 39  . Asthma Son     Patient Active Problem List   Diagnosis Date Noted  . Right tibial fracture 01/11/2019    Past Surgical History:  Procedure Laterality Date  . TIBIA IM NAIL INSERTION Right 01/12/2019   Procedure: INTRAMEDULLARY (IM) NAIL TIBIAL;  Surgeon: Deeann Saint, MD;  Location: [facility] ORS;  Service: Orthopedics;  Laterality: Right;     Current Outpatient Medications:  .  cyclobenzaprine (FLEXERIL) 10 MG tablet, Take 1 tablet (10 mg total) by mouth 3 (three) times daily as needed. (Patient not taking: Reported on 03/15/2019), Disp: 30 tablet, Rfl: 0 .  HYDROcodone-acetaminophen (NORCO/VICODIN) 5-325 MG tablet, Take 1  tablet by mouth every 4 (four) hours as needed for moderate pain. (Patient not taking: Reported on 03/15/2019), Disp: 22 tablet, Rfl: 0 .  meloxicam (MOBIC) 15 MG tablet, Take 1 tablet (15 mg total) by mouth daily. (Patient not taking: Reported on 03/15/2019), Disp: 30 tablet, Rfl: 2  No Known Allergies  Patient Care Team: Jesse Berry, PA-C as PCP - General (Family Medicine)  I personally reviewed active problem list, medication list, allergies, family history, social history, health maintenance, notes from last encounter, lab results, imaging with the patient/caregiver today.  Review of Systems  Constitutional: Negative.  Negative for activity change, appetite change, fatigue and unexpected weight change.  HENT: Negative.   Eyes: Negative.   Respiratory: Negative.  Negative for shortness of breath.   Cardiovascular: Negative.  Negative for chest pain, palpitations and leg swelling.  Gastrointestinal: Negative.  Negative for abdominal pain and blood in stool.  Endocrine: Negative.   Genitourinary: Negative.  Negative for decreased urine volume, difficulty urinating, testicular pain and urgency.  Skin: Negative.  Negative for color change and pallor.  Allergic/Immunologic: Negative.   Neurological: Negative.  Negative for syncope, weakness, light-headedness and numbness.  Psychiatric/Behavioral: Negative.  Negative for confusion, dysphoric mood, self-injury and suicidal ideas. The patient is not nervous/anxious.   All other systems reviewed and are negative.         Objective:  Vitals:  Vitals:   03/15/19 0948  BP: 124/74  Pulse: 70  Resp: 14  Temp: 98.1 F (36.7 C)  SpO2: 99%  Weight: 193 lb 9.6 oz (87.8 kg)  Height: 5\' 11"  (1.803 m)    Body mass index is 27 kg/m.  Physical Exam Vitals signs and nursing note reviewed.  Constitutional:      General: He is not in acute distress.    Appearance: Normal appearance. He is well-developed. He is not ill-appearing,  toxic-appearing or diaphoretic.     Interventions: Face mask in place.  HENT:     Head: Normocephalic and atraumatic.     Jaw: No trismus.     Right Ear: External ear normal.     Left Ear: External ear normal.  Eyes:     General: Lids are normal. No scleral icterus.    Conjunctiva/sclera: Conjunctivae normal.     Pupils: Pupils are equal, round, and reactive to light.  Neck:     Musculoskeletal: Normal range of motion and neck supple.     Trachea: Trachea and phonation normal. No tracheal deviation.  Cardiovascular:     Rate and Rhythm: Normal rate and regular rhythm.     Pulses: Normal pulses.          Radial pulses are 2+ on the right side and 2+ on the left side.       Posterior tibial pulses are 2+ on the right side and 2+ on the left side.     Heart sounds: Normal heart sounds. No murmur. No friction rub. No gallop.   Pulmonary:     Effort: Pulmonary effort is normal. No respiratory distress.     Breath sounds: Normal breath sounds. No stridor. No wheezing, rhonchi or rales.  Abdominal:     General: Bowel sounds are normal. There is no distension.     Palpations: Abdomen is soft.     Tenderness: There is no abdominal tenderness. There is no guarding or rebound.  Musculoskeletal: Normal range of motion.     Right lower leg: No edema.     Left lower leg: No edema.  Skin:    General: Skin is warm and dry.     Capillary Refill: Capillary refill takes less than 2 seconds.     Coloration: Skin is not jaundiced.     Findings: No rash.     Nails: There is no clubbing.   Neurological:     Mental Status: He is alert.     Cranial Nerves: No dysarthria or facial asymmetry.     Motor: No tremor or abnormal muscle tone.     Gait: Gait normal.  Psychiatric:        Mood and Affect: Mood normal.        Speech: Speech normal.        Behavior: Behavior normal. Behavior is cooperative.      Recent Results (from the past 2160 hour(s))  Basic metabolic panel     Status: Abnormal    Collection Time: 01/11/19  9:37 AM  Result Value Ref Range   Sodium 136 135 - 145 mmol/L   Potassium 4.1 3.5 - 5.1 mmol/L   Chloride 102 98 - 111 mmol/L   CO2 26 22 - 32 mmol/L   Glucose, Bld 147 (H) 70 - 99 mg/dL   BUN 15 6 - 20 mg/dL   Creatinine, Ser 01/13/19 0.61 - 1.24 mg/dL   Calcium 9.0 8.9 - 7.62 mg/dL   GFR calc non  Af Amer >60 >60 mL/min   GFR calc Af Amer >60 >60 mL/min   Anion gap 8 5 - 15    Comment: Performed at [facility], [address] [place], [place] [address]  CBC with Differential     Status: Abnormal   Collection Time: 01/11/19  9:37 AM  Result Value Ref Range   WBC 9.5 4.0 - 10.5 K/uL   RBC 5.97 (H) 4.22 - 5.81 MIL/uL   Hemoglobin 14.0 13.0 - 17.0 g/dL   HCT 44.6 39.0 - 52.0 %   MCV 74.7 (L) 80.0 - 100.0 fL   MCH 23.5 (L) 26.0 - 34.0 pg   MCHC 31.4 30.0 - 36.0 g/dL   RDW 15.1 11.5 - 15.5 %   Platelets 279 150 - 400 K/uL   nRBC 0.0 0.0 - 0.2 %   Neutrophils Relative % 82 %   Neutro Abs 7.8 (H) 1.7 - 7.7 K/uL   Lymphocytes Relative 12 %   Lymphs Abs 1.1 0.7 - 4.0 K/uL   Monocytes Relative 6 %   Monocytes Absolute 0.6 0.1 - 1.0 K/uL   Eosinophils Relative 0 %   Eosinophils Absolute 0.0 0.0 - 0.5 K/uL   Basophils Relative 0 %   Basophils Absolute 0.0 0.0 - 0.1 K/uL   Immature Granulocytes 0 %   Abs Immature Granulocytes 0.03 0.00 - 0.07 K/uL    Comment: Performed at [facility], [address] [place], [place] [address]  Protime-INR     Status: None   Collection Time: 01/11/19 10:53 AM  Result Value Ref Range   Prothrombin Time 13.0 11.4 - 15.2 seconds   INR 1.0 0.8 - 1.2    Comment: (NOTE) INR goal varies based on device and disease states. Performed at [facility], [address] [place], [place] [address]   SARS Coronavirus 2 [facility], Performed in [facility] hospital lab)     Status: None   Collection Time: 01/11/19 10:53 AM  Result Value Ref Range   SARS Coronavirus 2 NEGATIVE NEGATIVE    Comment:  (NOTE) If result is NEGATIVE SARS-CoV-2 target nucleic acids are NOT DETECTED. The SARS-CoV-2 RNA is generally detectable in upper and lower  respiratory specimens during the acute phase of infection. The lowest  concentration of SARS-CoV-2 viral copies this assay can detect is 250  copies / mL. A negative result does not preclude SARS-CoV-2 infection  and should not be used as the sole basis for treatment or other  patient management decisions.  A negative result may occur with  improper specimen collection / handling, submission of specimen other  than nasopharyngeal swab, presence of viral mutation(s) within the  areas targeted by this assay, and inadequate number of viral copies  (<250 copies / mL). A negative result must be combined with clinical  observations, patient history, and epidemiological information. If result is POSITIVE SARS-CoV-2 target nucleic acids are DETECTED. The SARS-CoV-2 RNA is generally detectable in upper and lower  respiratory specimens dur ing the acute phase of infection.  Positive  results are indicative of active infection with SARS-CoV-2.  Clinical  correlation with patient history and other diagnostic information is  necessary to determine patient infection status.  Positive results do  not rule out bacterial infection or co-infection with other viruses. If result is PRESUMPTIVE POSTIVE SARS-CoV-2 nucleic acids MAY BE PRESENT.   A presumptive positive result was obtained on the submitted specimen  and confirmed on repeat testing.  While 2019 novel  coronavirus  (SARS-CoV-2) nucleic acids may be present in the submitted sample  additional confirmatory testing may be necessary for epidemiological  and / or clinical management purposes  to differentiate between  SARS-CoV-2 and other Sarbecovirus currently known to infect humans.  If clinically indicated additional testing with an alternate test  methodology [phone]) is advised. The SARS-CoV-2 RNA is  generally  detectable in upper and lower respiratory sp ecimens during the acute  phase of infection. The expected result is Negative. Fact Sheet for Patients:  BoilerBrush.com.cy Fact Sheet for Healthcare Providers: https://pope.com/ This test is not yet approved or cleared by the [place] FDA and has been authorized for detection and/or diagnosis of SARS-CoV-2 by FDA under an Emergency Use Authorization (EUA).  This EUA will remain in effect (meaning this test can be used) for the duration of the COVID-19 declaration under Section 564(b)(1) of the Act, 21 U.S.C. section 360bbb-3(b)(1), unless the authorization is terminated or revoked sooner. Performed at [facility], [address] [place], [place] [address]   Type and screen     Status: None   Collection Time: 01/11/19 10:54 AM  Result Value Ref Range   ABO/RH(D) O POS    Antibody Screen NEG    Sample Expiration      01/14/2019,2359 Performed at [facility], [address] [place], [place] [address]   Surgical PCR screen     Status: None   Collection Time: 01/11/19  4:23 PM   Specimen: Nasal Mucosa; Nasal Swab  Result Value Ref Range   MRSA, PCR NEGATIVE NEGATIVE   Staphylococcus aureus NEGATIVE NEGATIVE    Comment: (NOTE) The Xpert SA Assay (FDA approved for NASAL specimens in patients 45 years of age and older), is one component of a comprehensive surveillance program. It is not intended to diagnose infection nor to guide or monitor treatment. Performed at [facility], [address] [place], [place] [address]   HIV antibody (Routine Testing)     Status: None   Collection Time: 01/12/19  3:51 AM  Result Value Ref Range   HIV Screen 4th Generation wRfx Non Reactive Non Reactive    Comment: (NOTE) Performed At: [facility] [address] [place], [place] [id number] Jolene Schimke MD WU:[id number]   CBC     Status:  Abnormal   Collection Time: 01/12/19  3:51 AM  Result Value Ref Range   WBC 6.9 4.0 - 10.5 K/uL   RBC 5.50 4.22 - 5.81 MIL/uL   Hemoglobin 13.0 13.0 - 17.0 g/dL   HCT 25.3 66.4 - 40.3 %   MCV 77.6 (L) 80.0 - 100.0 fL   MCH 23.6 (L) 26.0 - 34.0 pg   MCHC 30.4 30.0 - 36.0 g/dL   RDW 47.4 (H) 25.9 - 56.3 %   Platelets 263 150 - 400 K/uL   nRBC 0.0 0.0 - 0.2 %    Comment: Performed at [facility], [address] [place], [place] [address]  Creatinine, serum     Status: None   Collection Time: 01/12/19  3:51 AM  Result Value Ref Range   Creatinine, Ser 1.13 0.61 - 1.24 mg/dL   GFR calc non Af Amer >60 >60 mL/min   GFR calc Af Amer >60 >60 mL/min    Comment: Performed at [facility], [address], [place], [place] [address]  Basic metabolic panel     Status: Abnormal   Collection Time: 01/13/19  5:39 AM  Result Value Ref Range  Sodium 137 135 - 145 mmol/L   Potassium 4.0 3.5 - 5.1 mmol/L   Chloride 100 98 - 111 mmol/L   CO2 29 22 - 32 mmol/L   Glucose, Bld 120 (H) 70 - 99 mg/dL   BUN 8 6 - 20 mg/dL   Creatinine, Ser 1.61 0.61 - 1.24 mg/dL   Calcium 8.6 (L) 8.9 - 10.3 mg/dL   GFR calc non Af Amer >60 >60 mL/min   GFR calc Af Amer >60 >60 mL/min   Anion gap 8 5 - 15    Comment: Performed at [facility], [address] [place], [place] [address]  Novel Coronavirus, NAA ([facility])     Status: None   Collection Time: 02/24/19 12:00 AM   Specimen: Oropharyngeal(OP) collection in vial transport medium   OROPHARYNGEA  TESTING  Result Value Ref Range   SARS-CoV-2, NAA Not Detected Not Detected    Comment: Testing was performed using the cobas(R) SARS-CoV-2 test. This nucleic acid amplification test was developed and its performance characteristics determined by [employer]. Nucleic acid amplification tests include PCR and TMA. This test has not been FDA cleared or approved. This test has been authorized by FDA under an Emergency Use  Authorization (EUA). This test is only authorized for the duration of time the declaration that circumstances exist justifying the authorization of the emergency use of in vitro diagnostic tests for detection of SARS-CoV-2 virus and/or diagnosis of COVID-19 infection under section 564(b)(1) of the Act, 21 U.S.C. 540JWJ-1(B) (1), unless the authorization is terminated or revoked sooner. When diagnostic testing is negative, the possibility of a false negative result should be considered in the context of a patient's recent exposures and the presence of clinical signs and symptoms consistent with COVID-19. An individual without symptoms  of COVID-19 and who is not shedding SARS-CoV-2 virus would expect to have a negative (not detected) result in this assay.   Novel Coronavirus, NAA ([facility])     Status: None   Collection Time: 03/11/19 12:00 AM   Specimen: Oropharyngeal(OP) collection in vial transport medium   OROPHARYNGEA  TESTING  Result Value Ref Range   SARS-CoV-2, NAA Not Detected Not Detected    Comment: This nucleic acid amplification test was developed and its performance characteristics determined by [employer]. Nucleic acid amplification tests include PCR and TMA. This test has not been FDA cleared or approved. This test has been authorized by FDA under an Emergency Use Authorization (EUA). This test is only authorized for the duration of time the declaration that circumstances exist justifying the authorization of the emergency use of in vitro diagnostic tests for detection of SARS-CoV-2 virus and/or diagnosis of COVID-19 infection under section 564(b)(1) of the Act, 21 U.S.C. 147WGN-5(A) (1), unless the authorization is terminated or revoked sooner. When diagnostic testing is negative, the possibility of a false negative result should be considered in the context of a patient's recent exposures and the presence of clinical signs and symptoms consistent with  COVID-19. An individual without symptoms of COVID-19 and who is not shedding SARS-CoV-2 virus would  expect to have a negative (not detected) result in this assay.      PHQ2/9: Depression screen [facility] 2/9 03/15/2019 02/05/2019  Decreased Interest 0 0  Down, Depressed, Hopeless 0 0  PHQ - 2 Score 0 0  Altered sleeping 0 0  Tired, decreased energy 0 0  Change in appetite 0 0  Feeling bad or failure about yourself  0 0  Trouble concentrating 0 0  Moving slowly or fidgety/restless 0 0  Suicidal thoughts 0 0  PHQ-9 Score 0 0  Difficult doing work/chores Not difficult at all Not difficult at all    Fall Risk: Fall Risk  03/15/2019 02/05/2019  Falls in the past year? 0 0  Number falls in past yr: 0 0  Injury with Fall? 0 0    Functional Status Survey: Is the patient deaf or have difficulty hearing?: No Does the patient have difficulty seeing, even when wearing glasses/contacts?: No Does the patient have difficulty concentrating, remembering, or making decisions?: No Does the patient have difficulty walking or climbing stairs?: No Does the patient have difficulty dressing or bathing?: No Does the patient have difficulty doing errands alone such as visiting a doctor's office or shopping?: No   Assessment & Plan:    CPE completed today  . Prostate cancer screening and PSA options (with potential risks and benefits of testing vs not testing) were discussed along with recent recs/guidelines, shared decision making and handout/information given to pt today  . USPSTF grade A and B recommendations reviewed with patient; age-appropriate recommendations, preventive care, screening tests, etc discussed and encouraged; healthy living encouraged; see AVS for patient education given to patient  . Discussed importance of 150 minutes of physical activity weekly, AHA exercise recommendations given to pt in AVS/handout  . Discussed importance of healthy diet:  eating lean meats and proteins,  avoiding trans fats and saturated fats, avoid simple sugars and excessive carbs in diet, eat 6 servings of fruit/vegetables daily and drink plenty of water and avoid sweet beverages.  DASH diet reviewed if pt has HTN  . Recommended pt to do annual eye exam and routine dental exams/cleanings  . Reviewed Health Maintenance: Health Maintenance  Topic Date Due  . INFLUENZA VACCINE  08/18/2019 (Originally 12/19/2018)  . TETANUS/TDAP  03/14/2020 (Originally 07/24/2002)  . HIV Screening  Completed    . Immunizations:  There is no immunization history on file for this patient. Pt refused flu - discussed for several minutes today, encouraged him to come back for nurse visit if he would like to get. Is also due for Tdap, but does not want.     Jesse Berry, PA-C 03/15/19 10:34 AM  [facility]

## 2019-03-15 NOTE — Patient Instructions (Signed)
Preventive Care 19-35 Years Old, Male Preventive care refers to lifestyle choices and visits with your health care provider that can promote health and wellness. This includes:  A yearly physical exam. This is also called an annual well check.  Regular dental and eye exams.  Immunizations.  Screening for certain conditions.  Healthy lifestyle choices, such as eating a healthy diet, getting regular exercise, not using drugs or products that contain nicotine and tobacco, and limiting alcohol use. What can I expect for my preventive care visit? Physical exam Your health care provider will check:  Height and weight. These may be used to calculate body mass index (BMI), which is a measurement that tells if you are at a healthy weight.  Heart rate and blood pressure.  Your skin for abnormal spots. Counseling Your health care provider may ask you questions about:  Alcohol, tobacco, and drug use.  Emotional well-being.  Home and relationship well-being.  Sexual activity.  Eating habits.  Work and work Statistician. What immunizations do I need?  Influenza (flu) vaccine  This is recommended every year. Tetanus, diphtheria, and pertussis (Tdap) vaccine  You may need a Td booster every 10 years. Varicella (chickenpox) vaccine  You may need this vaccine if you have not already been vaccinated. Human papillomavirus (HPV) vaccine  If recommended by your health care provider, you may need three doses over 6 months. Measles, mumps, and rubella (MMR) vaccine  You may need at least one dose of MMR. You may also need a second dose. Meningococcal conjugate (MenACWY) vaccine  One dose is recommended if you are 45-76 years old and a Market researcher living in a residence hall, or if you have one of several medical conditions. You may also need additional booster doses. Pneumococcal conjugate (PCV13) vaccine  You may need this if you have certain conditions and were not  previously vaccinated. Pneumococcal polysaccharide (PPSV23) vaccine  You may need one or two doses if you smoke cigarettes or if you have certain conditions. Hepatitis A vaccine  You may need this if you have certain conditions or if you travel or work in places where you may be exposed to hepatitis A. Hepatitis B vaccine  You may need this if you have certain conditions or if you travel or work in places where you may be exposed to hepatitis B. Haemophilus influenzae type b (Hib) vaccine  You may need this if you have certain risk factors. You may receive vaccines as individual doses or as more than one vaccine together in one shot (combination vaccines). Talk with your health care provider about the risks and benefits of combination vaccines. What tests do I need? Blood tests  Lipid and cholesterol levels. These may be checked every 5 years starting at age 17.  Hepatitis C test.  Hepatitis B test. Screening   Diabetes screening. This is done by checking your blood sugar (glucose) after you have not eaten for a while (fasting).  Sexually transmitted disease (STD) testing. Talk with your health care provider about your test results, treatment options, and if necessary, the need for more tests. Follow these instructions at home: Eating and drinking   Eat a diet that includes fresh fruits and vegetables, whole grains, lean protein, and low-fat dairy products.  Take vitamin and mineral supplements as recommended by your health care provider.  Do not drink alcohol if your health care provider tells you not to drink.  If you drink alcohol: ? Limit how much you have to 0-2  drinks a day. ? Be aware of how much alcohol is in your drink. In the [place] one drink equals one 12 oz bottle of beer (355 mL), one 5 oz glass of wine (148 mL), or one 1 oz glass of hard liquor (44 mL). Lifestyle  Take daily care of your teeth and gums.  Stay active. Exercise for at least 30 minutes on 5 or  more days each week.  Do not use any products that contain nicotine or tobacco, such as cigarettes, e-cigarettes, and chewing tobacco. If you need help quitting, ask your health care provider.  If you are sexually active, practice safe sex. Use a condom or other form of protection to prevent STIs (sexually transmitted infections). What's next?  Go to your health care provider once a year for a well check visit.  Ask your health care provider how often you should have your eyes and teeth checked.  Stay up to date on all vaccines. This information is not intended to replace advice given to you by your health care provider. Make sure you discuss any questions you have with your health care provider. Document Released: 07/02/2001 Document Revised: 04/30/2018 Document Reviewed: 04/30/2018 Elsevier Patient Education  2020 [employer]  

## 2019-03-16 ENCOUNTER — Telehealth: Payer: Self-pay

## 2019-03-16 DIAGNOSIS — D509 Iron deficiency anemia, unspecified: Secondary | ICD-10-CM

## 2019-03-16 NOTE — Telephone Encounter (Signed)
-----   Message from Delsa Grana, [place] sent at 03/16/2019 12:45 PM EDT ----- Can you please add on iron panel (iron, TIBC, ferritin, % sat) for dx microcytic anemia

## 2019-03-16 NOTE — Telephone Encounter (Signed)
iro

## 2019-03-17 LAB — CBC WITH DIFFERENTIAL/PLATELET
Absolute Monocytes: 369 cells/uL (ref 200–950)
Basophils Absolute: 41 cells/uL (ref 0–200)
Basophils Relative: 1 %
Eosinophils Absolute: 41 cells/uL (ref 15–500)
Eosinophils Relative: 1 %
HCT: 42.7 % (ref 38.5–50.0)
Hemoglobin: 13.1 g/dL — ABNORMAL LOW (ref 13.2–17.1)
Lymphs Abs: 1591 cells/uL (ref 850–3900)
MCH: 22.8 pg — ABNORMAL LOW (ref 27.0–33.0)
MCHC: 30.7 g/dL — ABNORMAL LOW (ref 32.0–36.0)
MCV: 74.3 fL — ABNORMAL LOW (ref 80.0–100.0)
MPV: 9.8 fL (ref 7.5–12.5)
Monocytes Relative: 9 %
Neutro Abs: 2058 cells/uL (ref 1500–7800)
Neutrophils Relative %: 50.2 %
Platelets: 301 10*3/uL (ref 140–400)
RBC: 5.75 10*6/uL (ref 4.20–5.80)
RDW: 15.5 % — ABNORMAL HIGH (ref 11.0–15.0)
Total Lymphocyte: 38.8 %
WBC: 4.1 10*3/uL (ref 3.8–10.8)

## 2019-03-17 LAB — COMPLETE METABOLIC PANEL WITH GFR
AG Ratio: 1.4 (calc) (ref 1.0–2.5)
ALT: 39 U/L (ref 9–46)
AST: 27 U/L (ref 10–40)
Albumin: 4.3 g/dL (ref 3.6–5.1)
Alkaline phosphatase (APISO): 116 U/L (ref 36–130)
BUN: 13 mg/dL (ref 7–25)
CO2: 26 mmol/L (ref 20–32)
Calcium: 9.4 mg/dL (ref 8.6–10.3)
Chloride: 105 mmol/L (ref 98–110)
Creat: 1.17 mg/dL (ref 0.60–1.35)
GFR, Est African American: 93 mL/min/{1.73_m2} (ref >=60)
GFR, Est Non African American: 80 mL/min/{1.73_m2} (ref >=60)
Globulin: 3 g/dL (calc) (ref 1.9–3.7)
Glucose, Bld: 91 mg/dL (ref 65–99)
Potassium: 4.5 mmol/L (ref 3.5–5.3)
Sodium: 140 mmol/L (ref 135–146)
Total Bilirubin: 0.4 mg/dL (ref 0.2–1.2)
Total Protein: 7.3 g/dL (ref 6.1–8.1)

## 2019-03-17 LAB — LIPID PANEL
Cholesterol: 178 mg/dL (ref <200)
HDL: 45 mg/dL (ref >=40)
LDL Cholesterol (Calc): 104 mg/dL (calc) — ABNORMAL HIGH
Non-HDL Cholesterol (Calc): 133 mg/dL (calc) — ABNORMAL HIGH (ref <130)
Total CHOL/HDL Ratio: 4 (calc) (ref <5.0)
Triglycerides: 173 mg/dL — ABNORMAL HIGH (ref <150)

## 2019-03-17 LAB — IRON,TIBC AND FERRITIN PANEL
%SAT: 25 % (calc) (ref 20–48)
Ferritin: 175 ng/mL (ref 38–380)
Iron: 78 ug/dL (ref 50–180)
TIBC: 316 mcg/dL (calc) (ref 250–425)

## 2019-03-17 LAB — HEMOGLOBIN A1C
Hgb A1c MFr Bld: 5.5 % of total Hgb (ref <5.7)
Mean Plasma Glucose: 111 (calc)
eAG (mmol/L): 6.2 (calc)

## 2019-03-17 LAB — TEST AUTHORIZATION

## 2019-03-17 LAB — TSH: TSH: 0.85 mIU/L (ref 0.40–4.50)

## 2021-03-27 IMAGING — DX RIGHT ANKLE - COMPLETE 3+ VIEW
3 series · 3 of 3 positions shown · non-contrast
Comparison: None.

CLINICAL DATA: Right leg pain after fall.

EXAM:
RIGHT ANKLE - COMPLETE 3+ VIEW

[ankle ap]
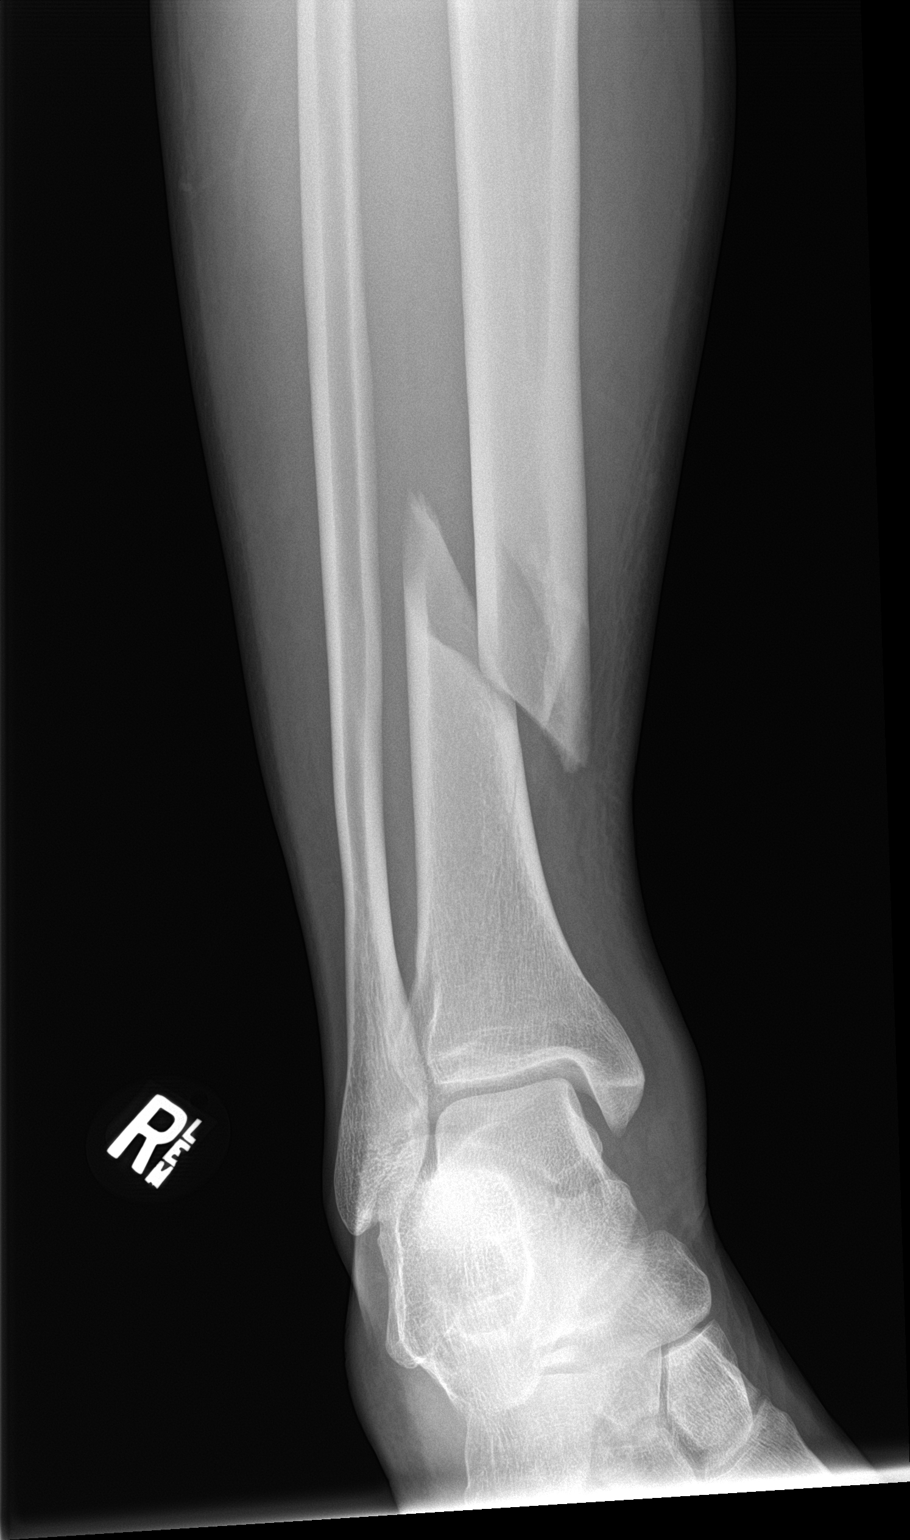

[ankle obl]
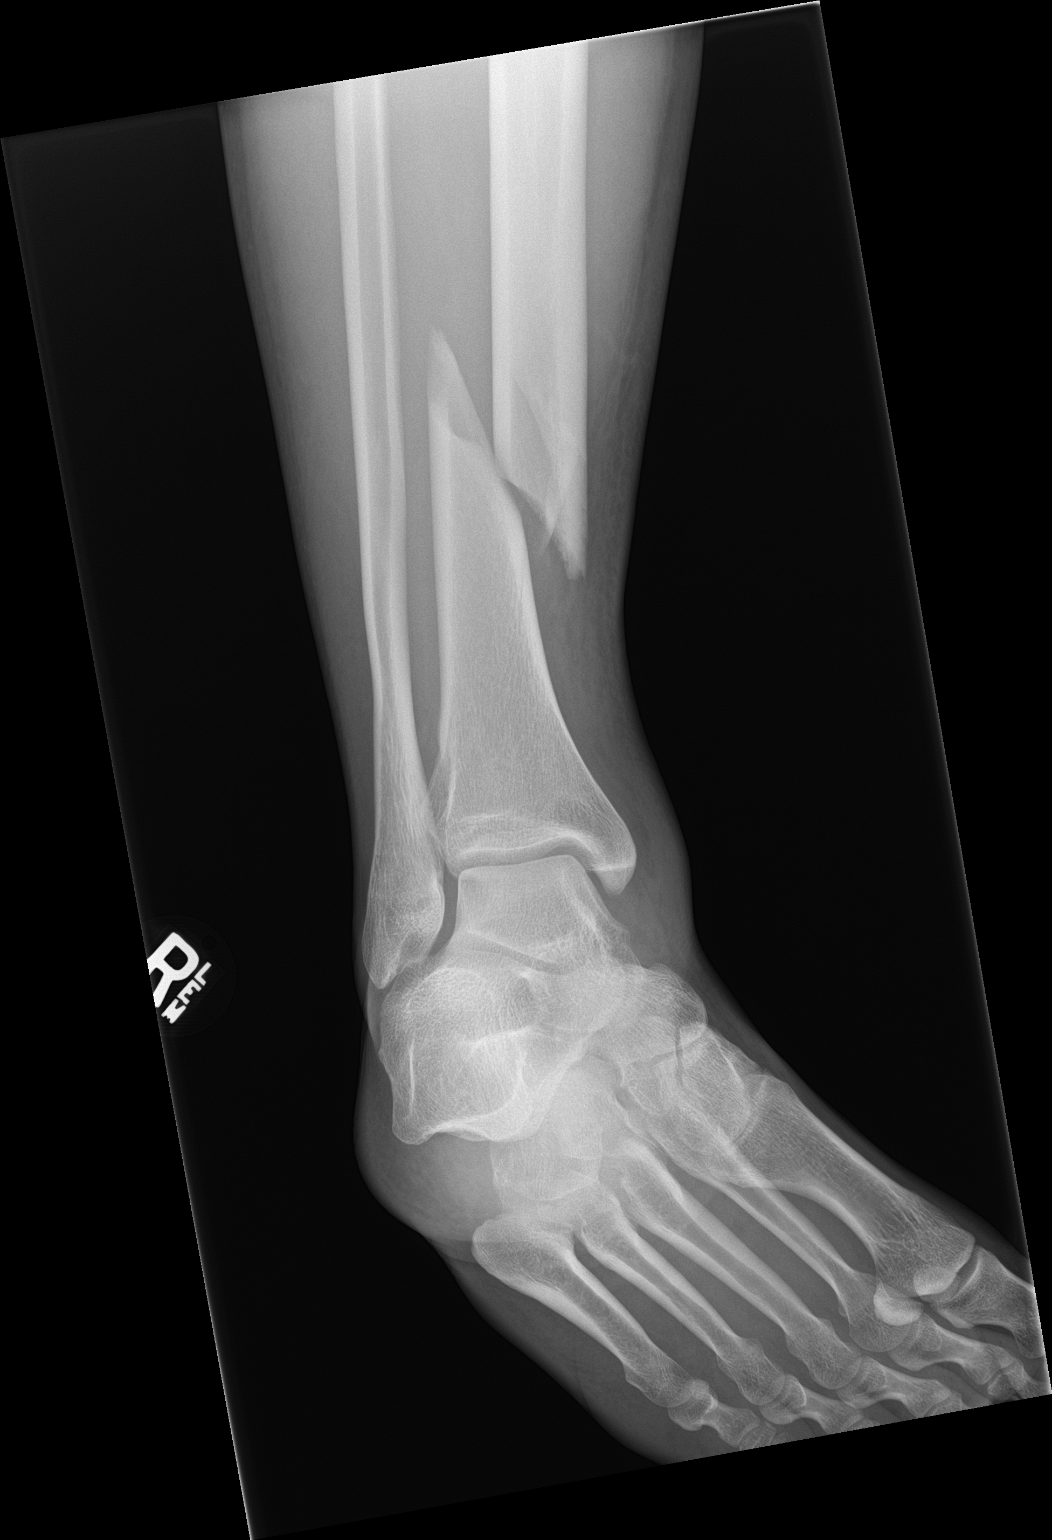

[ankle lat]
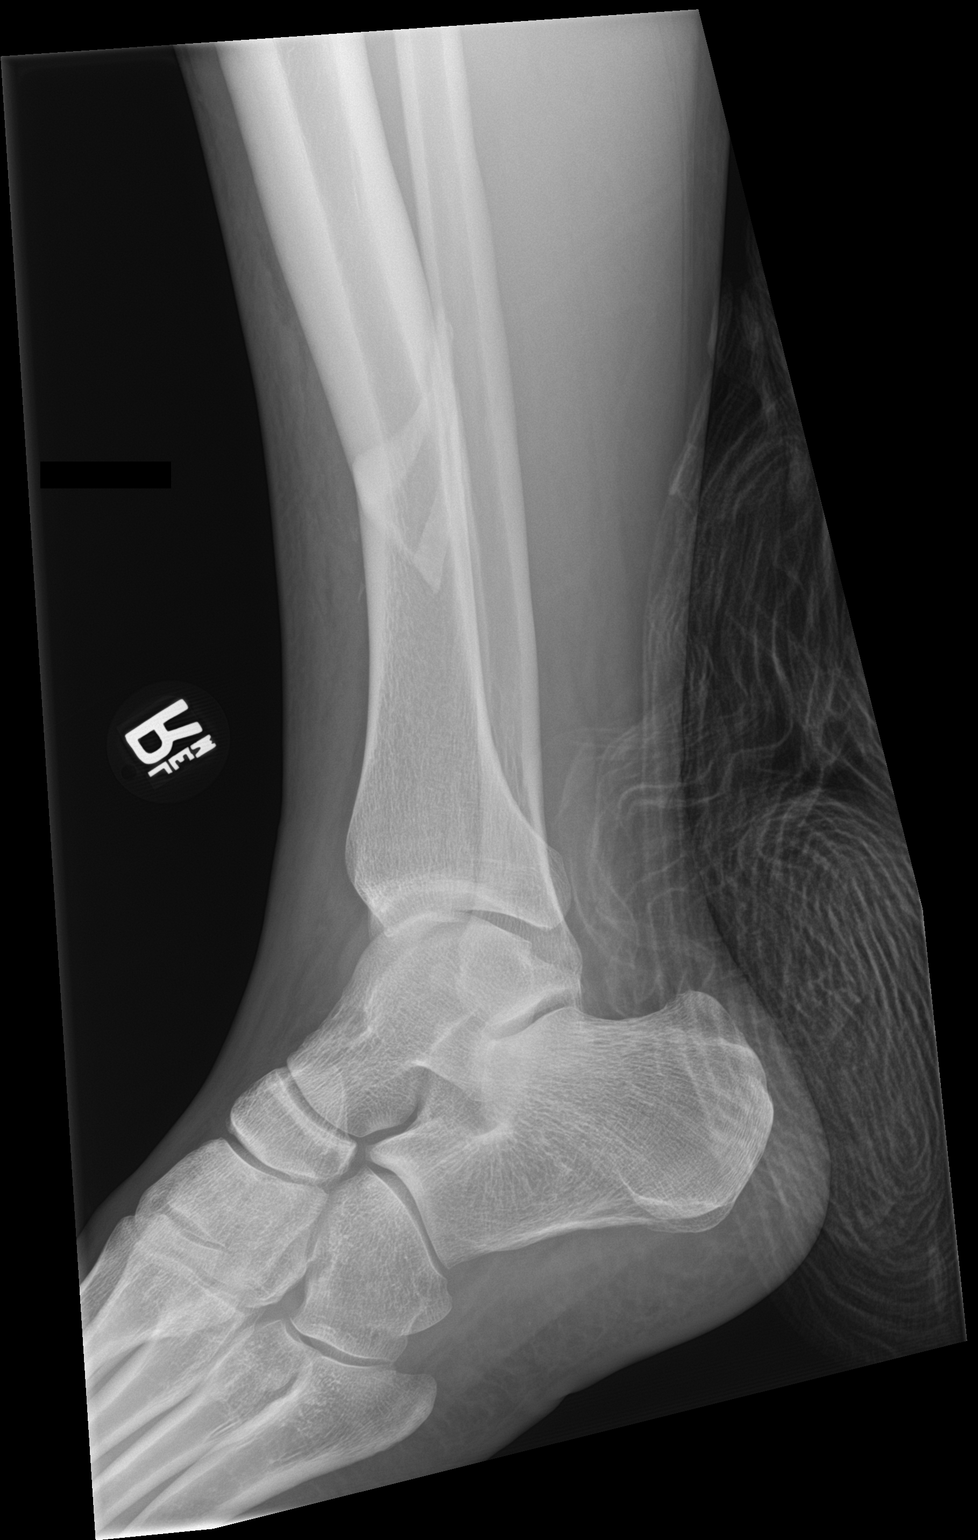

[3 of 3 positions shown; findings below may reference images not displayed]

FINDINGS: Severely displaced oblique fracture is seen involving the distal
right tibial shaft. No other definite abnormality is noted in the
ankle. Joint spaces are intact.
IMPRESSION: Severely displaced distal right tibial oblique fracture.

## 2021-03-28 IMAGING — XA RIGHT TIBIA AND FIBULA - 2 VIEW
3 series · 3 of 3 positions shown · non-contrast
Comparison: None.

CLINICAL DATA: Right tib fib surgery

EXAM:
DG C-ARM 1-60 MIN; RIGHT TIBIA AND FIBULA - 2 VIEW
FLUOROSCOPY TIME:  Fluoroscopy Time:  1 minutes 7 seconds

[Series 11: cont. · 1 of 1 slices shown (1 of 3)]
[im 1/1]
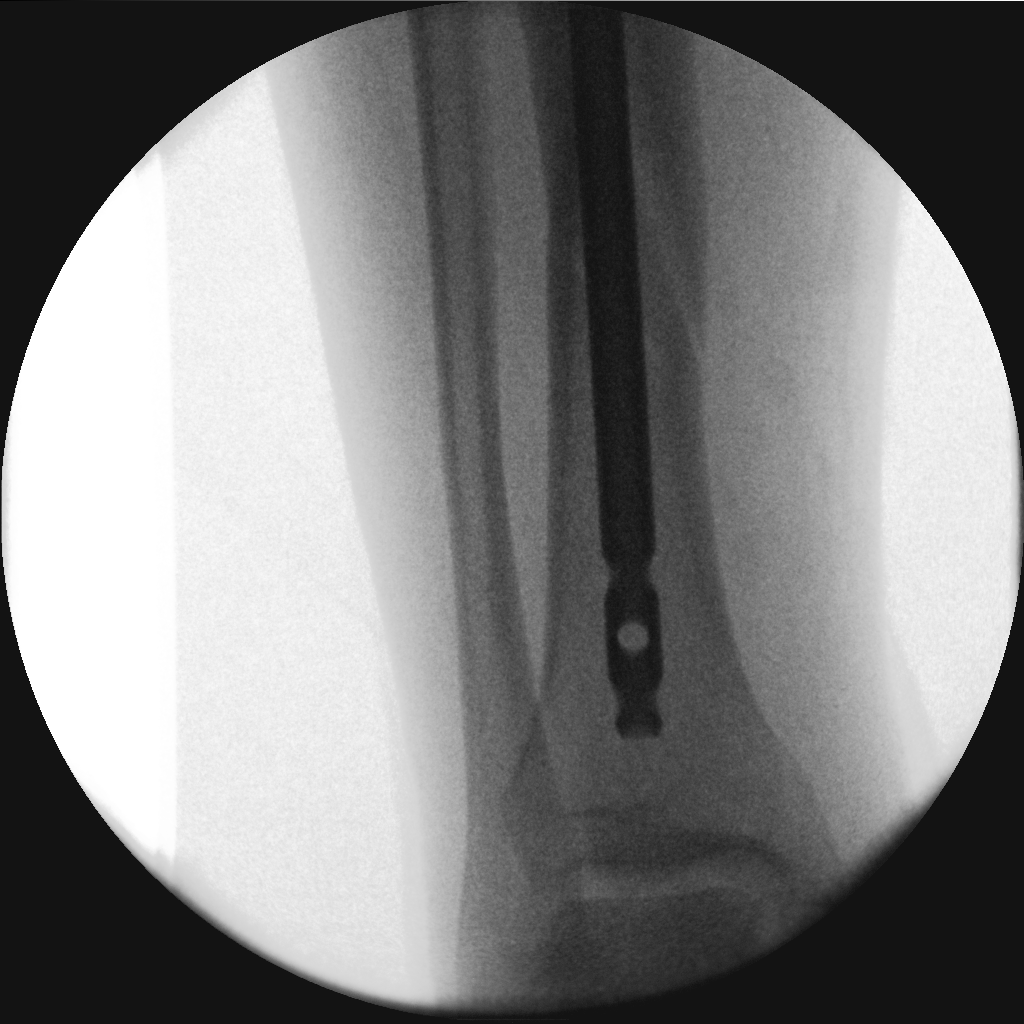

[Series 12: cont. · 1 of 1 slices shown (2 of 3)]
[im 1/1]
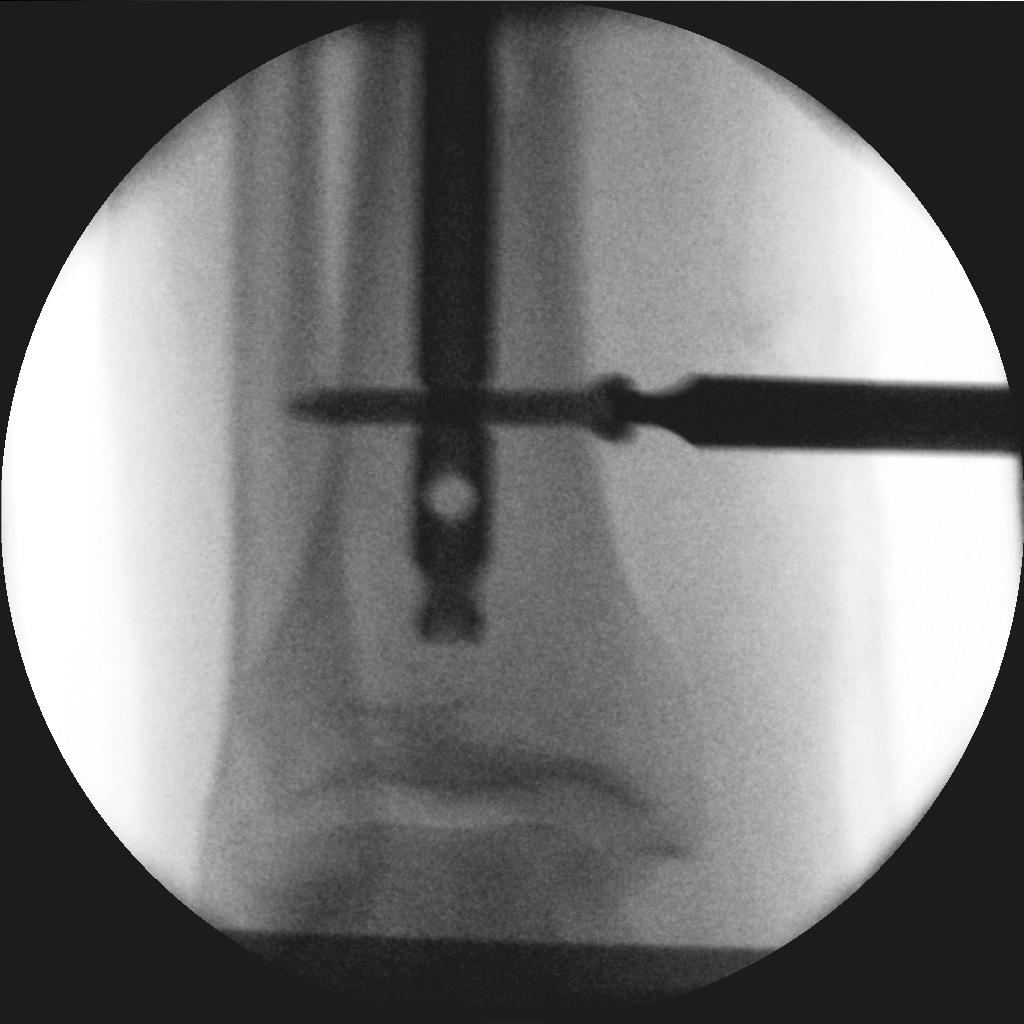

[Series 13: cont. · 1 of 1 slices shown (3 of 3)]
[im 1/1]
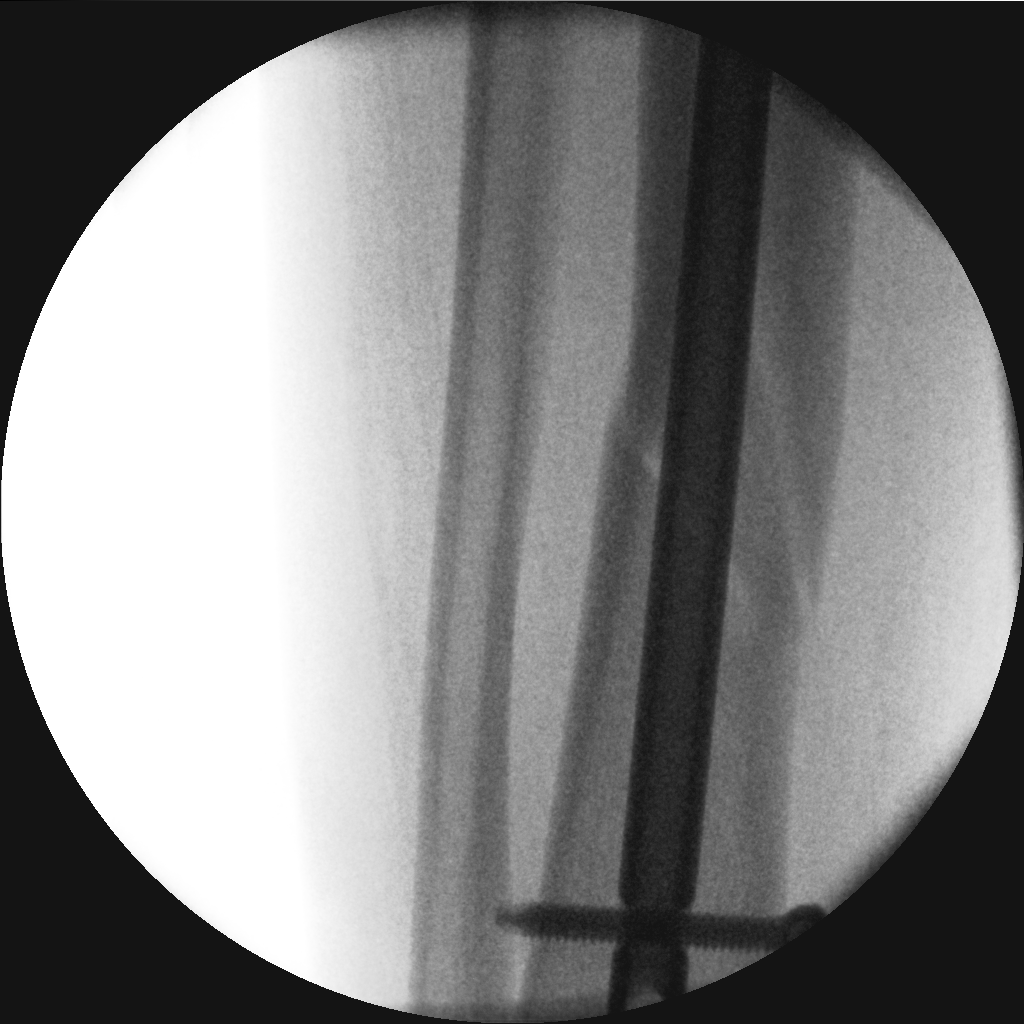

[3 of 3 positions shown; findings below may reference images not displayed]

FINDINGS: Three intraoperative fluoroscopic spot images are provided. Interval
placement of a intramedullary nail and interlocking screw
transfixing an oblique distal tibial diaphysis fracture.
IMPRESSION: Intraoperative localization.

## 2021-03-28 IMAGING — XA DG C-ARM 1-60 MIN
1 series · 1 of 1 positions shown · non-contrast
Comparison: None.

CLINICAL DATA: Right tib fib surgery

EXAM:
DG C-ARM 1-60 MIN; RIGHT TIBIA AND FIBULA - 2 VIEW
FLUOROSCOPY TIME:  Fluoroscopy Time:  1 minutes 7 seconds

[Series 11: run · 1 of 1 slices shown]
[im 1/1]
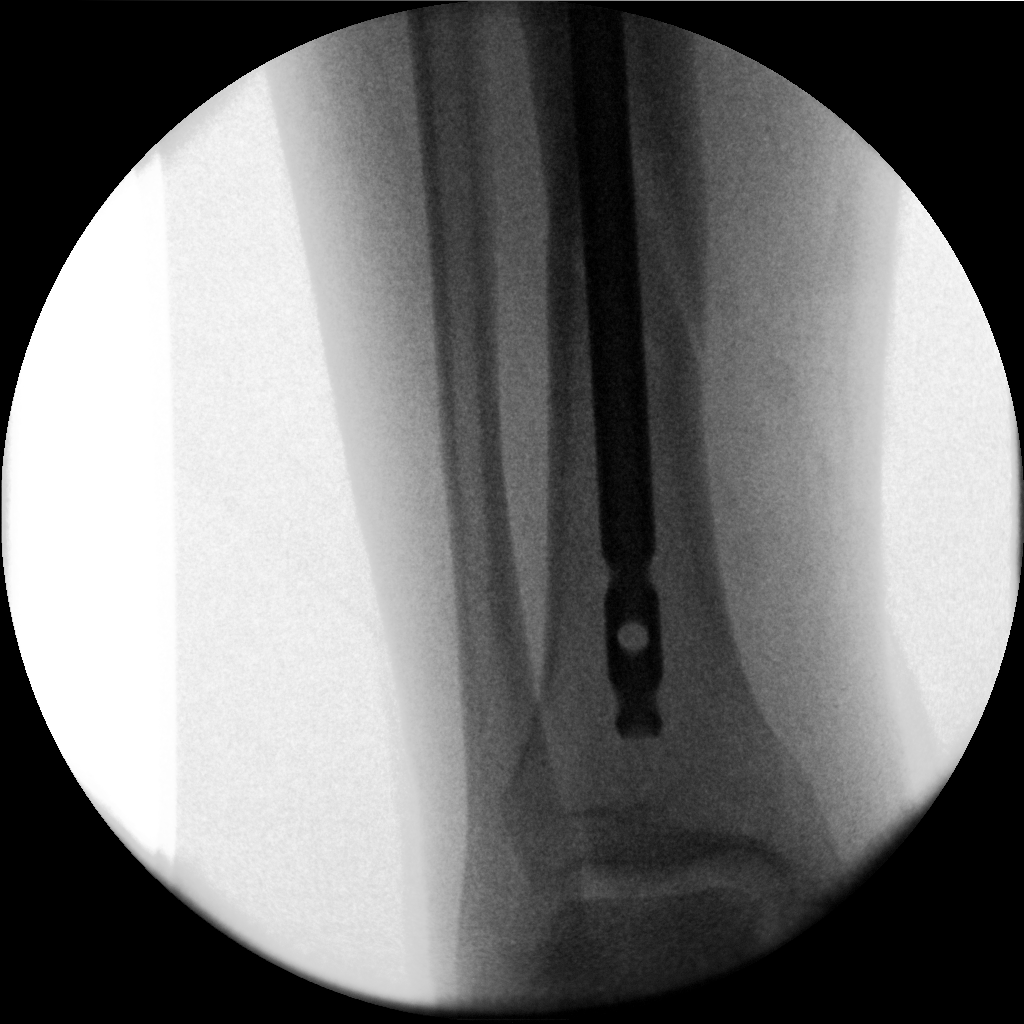

[1 of 1 positions shown; findings below may reference images not displayed]

FINDINGS: Three intraoperative fluoroscopic spot images are provided. Interval
placement of a intramedullary nail and interlocking screw
transfixing an oblique distal tibial diaphysis fracture.
IMPRESSION: Intraoperative localization.
